# Patient Record
Sex: Male | Born: 1960 | Race: Black or African American | Hispanic: No | Marital: Single | Smoking: Current every day smoker
Health system: Southern US, Community
[De-identification: ages and names within clinical notes are randomized; demographics above are authoritative.]

## PROBLEM LIST (undated history)

## (undated) DIAGNOSIS — I10 Essential (primary) hypertension: Secondary | ICD-10-CM

## (undated) DIAGNOSIS — C801 Malignant (primary) neoplasm, unspecified: Secondary | ICD-10-CM

## (undated) HISTORY — DX: Essential (primary) hypertension: I10

## (undated) HISTORY — PX: NO PAST SURGERIES: SHX2092

---

## 2008-08-23 ENCOUNTER — Emergency Department: Payer: Self-pay | Admitting: Unknown Physician Specialty

## 2014-12-01 ENCOUNTER — Emergency Department
Admission: EM | Admit: 2014-12-01 | Discharge: 2014-12-01 | Disposition: A | Discharge status: Home / Self Care | Payer: Self-pay | Attending: Emergency Medicine | Admitting: Emergency Medicine

## 2014-12-01 ENCOUNTER — Encounter: Payer: Self-pay | Admitting: *Deleted

## 2014-12-01 DIAGNOSIS — G629 Polyneuropathy, unspecified: Secondary | ICD-10-CM | POA: Insufficient documentation

## 2014-12-01 DIAGNOSIS — Z720 Tobacco use: Secondary | ICD-10-CM | POA: Insufficient documentation

## 2014-12-01 DIAGNOSIS — M79672 Pain in left foot: Secondary | ICD-10-CM | POA: Insufficient documentation

## 2014-12-01 LAB — GLUCOSE, CAPILLARY: GLUCOSE-CAPILLARY: 98 mg/dL (ref 65–99)

## 2014-12-01 MED ORDER — NAPROXEN 500 MG PO TABS
500.0000 mg | ORAL_TABLET | Freq: Once | ORAL | Status: AC
  Administered 2014-12-01: 500 mg via ORAL

## 2014-12-01 MED ORDER — NAPROXEN 500 MG PO TABS
ORAL_TABLET | ORAL | Status: AC
  Administered 2014-12-01: 500 mg via ORAL
  Filled 2014-12-01: qty 1

## 2014-12-01 NOTE — ED Notes (Signed)
Pt. States feet burning for the past year.  Pt. States "It feels like I am walking on layers of skin"  Pt. States job keeps him on his feet on concrete.  Pt. States "I drink about a pint of liqueur every day.

## 2014-12-01 NOTE — Discharge Instructions (Signed)

## 2014-12-01 NOTE — ED Notes (Signed)
Patient c/o both feet hurting, feels like burning.

## 2014-12-01 NOTE — ED Provider Notes (Signed)
[facility] Emergency Department Provider Note  ____________________________________________  Time seen: On arrival  I have reviewed the triage vital signs and the nursing notes.   HISTORY  Chief Complaint Foot Pain    HPI Tyler Holloway is a 54 y.o. male who presents with complaints of burning in bilateral feet. He reports the bottom of his feet feel like he is walking on layers of skin. He has had these symptoms for approximately one year. He denies a history of diabetes. Does admit to alcohol abuse but does not want help with a sore detox. He does walk a lot but he wears boots    History reviewed. No pertinent past medical history.  There are no active problems to display for this patient.   History reviewed. No pertinent past surgical history.  No current outpatient prescriptions on file.  Allergies Review of patient's allergies indicates no known allergies.  No family history on file.  Social History Social History  Substance Use Topics  . Smoking status: Current Every Day Smoker  . Smokeless tobacco: None  . Alcohol Use: Yes    Review of Systems  Constitutional: Negative for fever.     Musculoskeletal: Negative for back pain. Skin: Negative for rash. Neurological: Negative for headaches or focal weakness   ____________________________________________   PHYSICAL EXAM:  VITAL SIGNS: ED Triage Vitals  Enc Vitals Group     BP 12/01/14 1958 150/85 mmHg     Pulse Rate 12/01/14 1958 101     Resp 12/01/14 1958 18     Temp 12/01/14 1958 98.1 F (36.7 C)     Temp Source 12/01/14 1958 Oral     SpO2 12/01/14 1958 99 %     Weight 12/01/14 1958 175 lb (79.379 kg)     Height 12/01/14 1958 6\' 3"  (1.905 m)     Head Cir --      Peak Flow --      Pain Score 12/01/14 2000 10     Pain Loc --      Pain Edu? --      Excl. in [place]? --      Constitutional: Alert and oriented. Well appearing and in no distress. Eyes: Conjunctivae are  normal.  ENT   Head: Normocephalic and atraumatic.   Mouth/Throat: Mucous membranes are moist. Cardiovascular: Normal rate, regular rhythm.  Respiratory: Normal respiratory effort without tachypnea nor retractions.  Gastrointestinal: Soft and non-tender in all quadrants. No distention. There is no CVA tenderness. Musculoskeletal: Nontender with normal range of motion in all extremities. Foot exam is unremarkable bilaterally. He does have somewhat flat feet but no tenderness to palpation or abnormalities. No rash. Neurologic:  Normal speech and language. No gross focal neurologic deficits are appreciated. Skin:  Skin is warm, dry and intact. No rash noted. Psychiatric: Mood and affect are normal. Patient exhibits appropriate insight and judgment.  ____________________________________________    LABS (pertinent positives/negatives)  Labs Reviewed  GLUCOSE, CAPILLARY    ____________________________________________     ____________________________________________    RADIOLOGY I have personally reviewed any xrays that were ordered on this patient: None  ____________________________________________   PROCEDURES  Procedure(s) performed: none   ____________________________________________   INITIAL IMPRESSION / ASSESSMENT AND PLAN / ED COURSE  Pertinent labs & imaging results that were available during my care of the patient were reviewed by me and considered in my medical decision making (see chart for details).  Patient with relatively benign exam. Suspect neuropathy possibly related alcohol abuse. I've  asked him to follow up with outpatient PCP for further evaluation  ____________________________________________   FINAL CLINICAL IMPRESSION(S) / ED DIAGNOSES  Final diagnoses:  Neuropathy ([place])     Lavonia Drafts, MD 12/01/14 [phone]

## 2014-12-01 NOTE — ED Notes (Signed)
Pt. Going home with family.  Pt. [facility].

## 2014-12-25 ENCOUNTER — Ambulatory Visit: Payer: Self-pay

## 2014-12-25 LAB — BASIC METABOLIC PANEL
BUN: 13 mg/dL (ref 4–21)
Creatinine: 1.1 mg/dL (ref 0.6–1.3)
GLUCOSE: 93 mg/dL
Potassium: 4.4 mmol/L (ref 3.4–5.3)
SODIUM: 140 mmol/L (ref 137–147)

## 2014-12-25 LAB — CBC AND DIFFERENTIAL
HEMATOCRIT: 43 % (ref 41–53)
Hemoglobin: 14.7 g/dL (ref 13.5–17.5)
Neutrophils Absolute: 2 /uL
Platelets: 121 10*3/uL — AB (ref 150–399)
WBC: 5.6 10^3/mL

## 2014-12-25 LAB — TSH: TSH: 3.2 u[IU]/mL (ref 0.41–5.90)

## 2014-12-25 LAB — HEPATIC FUNCTION PANEL
ALT: 85 U/L — AB (ref 10–40)
AST: 100 U/L — AB (ref 14–40)
Alkaline Phosphatase: 129 U/L — AB (ref 25–125)
BILIRUBIN, TOTAL: 0.9 mg/dL

## 2015-01-01 ENCOUNTER — Ambulatory Visit: Payer: Self-pay

## 2015-01-03 ENCOUNTER — Ambulatory Visit: Payer: Self-pay

## 2015-02-14 ENCOUNTER — Ambulatory Visit: Payer: Self-pay

## 2015-02-14 DIAGNOSIS — N400 Enlarged prostate without lower urinary tract symptoms: Secondary | ICD-10-CM | POA: Insufficient documentation

## 2015-02-14 DIAGNOSIS — G629 Polyneuropathy, unspecified: Secondary | ICD-10-CM | POA: Insufficient documentation

## 2015-02-22 ENCOUNTER — Ambulatory Visit: Payer: Self-pay

## 2015-02-25 ENCOUNTER — Ambulatory Visit: Payer: Self-pay

## 2015-03-06 ENCOUNTER — Institutional Professional Consult (permissible substitution): Payer: Self-pay | Admitting: Urology

## 2015-03-21 ENCOUNTER — Ambulatory Visit: Payer: Self-pay

## 2015-03-28 DIAGNOSIS — N400 Enlarged prostate without lower urinary tract symptoms: Secondary | ICD-10-CM

## 2015-03-28 DIAGNOSIS — G588 Other specified mononeuropathies: Secondary | ICD-10-CM

## 2015-04-26 DIAGNOSIS — R768 Other specified abnormal immunological findings in serum: Secondary | ICD-10-CM | POA: Insufficient documentation

## 2015-05-08 ENCOUNTER — Institutional Professional Consult (permissible substitution): Payer: Self-pay | Admitting: Urology

## 2015-05-09 ENCOUNTER — Encounter: Payer: Self-pay | Admitting: Pharmacist

## 2015-05-15 ENCOUNTER — Encounter: Payer: Self-pay | Admitting: Urology

## 2015-05-15 ENCOUNTER — Institutional Professional Consult (permissible substitution): Payer: Self-pay | Admitting: Urology

## 2015-09-11 ENCOUNTER — Encounter: Payer: Self-pay | Admitting: Internal Medicine

## 2015-09-11 ENCOUNTER — Ambulatory Visit: Payer: Self-pay | Admitting: Internal Medicine

## 2015-09-11 DIAGNOSIS — B181 Chronic viral hepatitis B without delta-agent: Secondary | ICD-10-CM

## 2015-09-11 DIAGNOSIS — B1910 Unspecified viral hepatitis B without hepatic coma: Secondary | ICD-10-CM | POA: Insufficient documentation

## 2015-09-11 MED ORDER — NAPROXEN 250 MG PO TABS: 250.0000 mg | ORAL_TABLET | Freq: Two times a day (BID) | ORAL | 3 refills | Status: DC

## 2015-09-11 NOTE — Patient Instructions (Signed)
F/u in 3 months Labs today

## 2015-09-11 NOTE — Progress Notes (Signed)
   Subjective:    Patient ID: Tyler Holloway, male    DOB: Nov 30, 1960, 55 y.o.   MRN: [id number]  HPI   Pt presents w/ chronic foot pain in both feet, reports medication doesn't relieve pain, reports pain feels like his socks are loose and this feeling causes sleeplessness   BP is currently elevated at 144/89 Active hepatitis B  Patient Active Problem List   Diagnosis Date Noted  . Hepatitis B infection without delta agent without hepatic coma 09/11/2015  . Peripheral neuropathy ([place]) 02/14/2015  . Benign prostatic hyperplasia 02/14/2015     Medication List       Accurate as of 09/11/15 10:00 AM. Always use your most recent med list.          gabapentin 600 MG tablet Commonly known as:  NEURONTIN Take 600 mg by mouth daily.   tamsulosin 0.4 MG Caps capsule Commonly known as:  FLOMAX Take 0.4 mg by mouth daily.        Review of Systems    R foot examined, callous present on arch of foot, previous neuropathy normal vibrations of both feet by tuning fork Proprioception is normal Objective:   Physical Exam  BP (!) 144/89   Pulse 74   Temp 98.1 F (36.7 C)   Wt 164 lb (74.4 kg)   BMI 20.50 kg/m        Assessment & Plan:  Discontinue Gabapentin Naproxen prescribed  Hepatitis B retreated at [facility] Pain believed to not be caused by neuropathy, possibly related to Hepatitis B   Labs today: Met C, CBC, A1C, Lipid, TSH, Thiamin, UA F/u in 3 months after [facility] appt.  Referral to X-rays of both feet

## 2015-09-12 ENCOUNTER — Other Ambulatory Visit: Payer: Self-pay

## 2015-09-12 MED ORDER — NAPROXEN 250 MG PO TABS: 250.0000 mg | ORAL_TABLET | Freq: Two times a day (BID) | ORAL | 3 refills | Status: DC

## 2015-09-14 LAB — CBC WITH DIFFERENTIAL
BASOS: 1 %
Basophils Absolute: 0 10*3/uL (ref 0.0–0.2)
EOS (ABSOLUTE): 0.1 10*3/uL (ref 0.0–0.4)
EOS: 3 %
HEMOGLOBIN: 14.4 g/dL (ref 12.6–17.7)
Hematocrit: 42.8 % (ref 37.5–51.0)
IMMATURE GRANS (ABS): 0 10*3/uL (ref 0.0–0.1)
Immature Granulocytes: 0 %
LYMPHS: 42 %
Lymphocytes Absolute: 1.4 10*3/uL (ref 0.7–3.1)
MCH: 32.7 pg (ref 26.6–33.0)
MCHC: 33.6 g/dL (ref 31.5–35.7)
MCV: 97 fL (ref 79–97)
MONOCYTES: 16 %
Monocytes Absolute: 0.5 10*3/uL (ref 0.1–0.9)
NEUTROS ABS: 1.2 10*3/uL — AB (ref 1.4–7.0)
NEUTROS PCT: 38 %
RBC: 4.41 x10E6/uL (ref 4.14–5.80)
RDW: 13.9 % (ref 12.3–15.4)
WBC: 3.2 10*3/uL — AB (ref 3.4–10.8)

## 2015-09-14 LAB — URINALYSIS
BILIRUBIN UA: NEGATIVE
Glucose, UA: NEGATIVE
Ketones, UA: NEGATIVE
Leukocytes, UA: NEGATIVE
Nitrite, UA: NEGATIVE
PH UA: 6 (ref 5.0–7.5)
RBC, UA: NEGATIVE
Specific Gravity, UA: 1.022 (ref 1.005–1.030)
Urobilinogen, Ur: 1 mg/dL (ref 0.2–1.0)

## 2015-09-14 LAB — LIPID PANEL
Chol/HDL Ratio: 2.6 ratio units (ref 0.0–5.0)
Cholesterol, Total: 120 mg/dL (ref 100–199)
HDL: 47 mg/dL (ref >39)
LDL CALC: 55 mg/dL (ref 0–99)
Triglycerides: 88 mg/dL (ref 0–149)
VLDL CHOLESTEROL CAL: 18 mg/dL (ref 5–40)

## 2015-09-14 LAB — COMPREHENSIVE METABOLIC PANEL
A/G RATIO: 1.1 — AB (ref 1.2–2.2)
ALBUMIN: 4 g/dL (ref 3.5–5.5)
ALT: 110 IU/L — AB (ref 0–44)
AST: 203 IU/L — ABNORMAL HIGH (ref 0–40)
Alkaline Phosphatase: 123 IU/L — ABNORMAL HIGH (ref 39–117)
BUN / CREAT RATIO: 10 (ref 9–20)
BUN: 14 mg/dL (ref 6–24)
Bilirubin Total: 1.4 mg/dL — ABNORMAL HIGH (ref 0.0–1.2)
CALCIUM: 9 mg/dL (ref 8.7–10.2)
CO2: 26 mmol/L (ref 18–29)
Chloride: 101 mmol/L (ref 96–106)
Creatinine, Ser: 1.42 mg/dL — ABNORMAL HIGH (ref 0.76–1.27)
GFR, EST AFRICAN AMERICAN: 64 mL/min/{1.73_m2} (ref >59)
GFR, EST NON AFRICAN AMERICAN: 55 mL/min/{1.73_m2} — AB (ref >59)
Globulin, Total: 3.5 g/dL (ref 1.5–4.5)
Glucose: 97 mg/dL (ref 65–99)
POTASSIUM: 4.4 mmol/L (ref 3.5–5.2)
Sodium: 139 mmol/L (ref 134–144)
TOTAL PROTEIN: 7.5 g/dL (ref 6.0–8.5)

## 2015-09-14 LAB — HEMOGLOBIN A1C
Est. average glucose Bld gHb Est-mCnc: 114 mg/dL
Hgb A1c MFr Bld: 5.6 % (ref 4.8–5.6)

## 2015-09-14 LAB — TSH: TSH: 3.09 u[IU]/mL (ref 0.450–4.500)

## 2015-09-14 LAB — VITAMIN B1: THIAMINE: 100.8 nmol/L (ref 66.5–200.0)

## 2015-09-17 ENCOUNTER — Other Ambulatory Visit: Payer: Self-pay

## 2015-09-17 DIAGNOSIS — M79672 Pain in left foot: Principal | ICD-10-CM

## 2015-09-17 DIAGNOSIS — M79671 Pain in right foot: Secondary | ICD-10-CM

## 2015-09-17 MED ORDER — NAPROXEN 250 MG PO TABS: 250.0000 mg | ORAL_TABLET | Freq: Two times a day (BID) | ORAL | 3 refills | Status: DC

## 2015-09-23 ENCOUNTER — Telehealth: Payer: Self-pay
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
## 2015-10-09 ENCOUNTER — Ambulatory Visit: Payer: Self-pay | Admitting: Internal Medicine

## 2015-11-04 DIAGNOSIS — B1910 Unspecified viral hepatitis B without hepatic coma: Secondary | ICD-10-CM | POA: Insufficient documentation

## 2015-11-14 ENCOUNTER — Other Ambulatory Visit: Payer: Self-pay | Admitting: Internal Medicine

## 2015-11-14 DIAGNOSIS — G8929 Other chronic pain: Secondary | ICD-10-CM

## 2015-11-14 DIAGNOSIS — M79671 Pain in right foot: Principal | ICD-10-CM

## 2015-11-14 DIAGNOSIS — M79672 Pain in left foot: Secondary | ICD-10-CM

## 2015-11-20 ENCOUNTER — Ambulatory Visit: Payer: Self-pay | Admitting: Internal Medicine

## 2015-12-03 ENCOUNTER — Ambulatory Visit
Admission: RE | Admit: 2015-12-03 | Discharge: 2015-12-03 | Disposition: A | Discharge status: Home / Self Care | Payer: Self-pay | Source: Ambulatory Visit | Attending: Internal Medicine | Admitting: Internal Medicine

## 2015-12-03 DIAGNOSIS — M79671 Pain in right foot: Secondary | ICD-10-CM | POA: Insufficient documentation

## 2015-12-03 DIAGNOSIS — M79672 Pain in left foot: Secondary | ICD-10-CM | POA: Insufficient documentation

## 2015-12-03 DIAGNOSIS — R938 Abnormal findings on diagnostic imaging of other specified body structures: Secondary | ICD-10-CM | POA: Insufficient documentation

## 2015-12-03 DIAGNOSIS — G8929 Other chronic pain: Secondary | ICD-10-CM

## 2015-12-11 ENCOUNTER — Ambulatory Visit: Payer: Self-pay | Admitting: Internal Medicine

## 2015-12-11 ENCOUNTER — Encounter: Payer: Self-pay | Admitting: Internal Medicine

## 2015-12-11 VITALS — BP 148/87 | HR 103 | Wt 168.0 lb

## 2015-12-11 DIAGNOSIS — B1920 Unspecified viral hepatitis C without hepatic coma: Secondary | ICD-10-CM

## 2015-12-11 DIAGNOSIS — B181 Chronic viral hepatitis B without delta-agent: Secondary | ICD-10-CM

## 2015-12-11 MED ORDER — GABAPENTIN 100 MG PO CAPS: 100.0000 mg | ORAL_CAPSULE | Freq: Three times a day (TID) | ORAL | 3 refills | Status: DC

## 2015-12-11 NOTE — Progress Notes (Signed)
   Subjective:    Patient ID: Tyler Holloway, male    DOB: [birth year], 55 y.o.   MRN: [id number]  HPI  Pt f/u for med refill. Pt reports has not received medications for chronic Hep C. Visit to [facility] on 10/9.  Pt complains of chronic pain in right foot. Reports gabapentin is not working. Xray results show arthritis.  Patient Active Problem List   Diagnosis Date Noted  . Hepatitis B infection without delta agent without hepatic coma 09/11/2015  . Peripheral neuropathy ([place]) 02/14/2015  . Benign prostatic hyperplasia 02/14/2015     Medication List       Accurate as of 12/11/15  9:18 AM. Always use your most recent med list.          gabapentin 100 MG capsule Commonly known as:  NEURONTIN Take 100 mg by mouth.   naproxen 250 MG tablet Commonly known as:  NAPROSYN Take 1 tablet (250 mg total) by mouth 2 (two) times daily with a meal.   tamsulosin 0.4 MG Caps capsule Commonly known as:  FLOMAX Take 0.4 mg by mouth daily.        Review of Systems     Objective:   Physical Exam  Constitutional: He is oriented to person, place, and time.  Cardiovascular: Normal rate, regular rhythm and normal heart sounds.   Pulmonary/Chest: Effort normal and breath sounds normal.  Neurological: He is alert and oriented to person, place, and time.    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 Nonsteroil and inflammatories recommended.     Assessment & Plan:   Needs Hep C medications from [facility] infectious disease clinic per Dr. Allen Norris, infectious disease specialist who saw Pt on 10/9.   F/u in January. No labs.

## 2015-12-11 NOTE — Patient Instructions (Signed)
F/u in January for Hep C. No labs.

## 2016-02-12 ENCOUNTER — Ambulatory Visit: Payer: Self-pay | Admitting: Internal Medicine

## 2017-02-08 ENCOUNTER — Encounter: Payer: Self-pay | Admitting: Emergency Medicine

## 2017-02-08 ENCOUNTER — Emergency Department
Admission: EM | Admit: 2017-02-08 | Discharge: 2017-02-08 | Disposition: A | Discharge status: Home / Self Care | Payer: Self-pay | Attending: Emergency Medicine | Admitting: Emergency Medicine

## 2017-02-08 ENCOUNTER — Other Ambulatory Visit: Payer: Self-pay

## 2017-02-08 DIAGNOSIS — R21 Rash and other nonspecific skin eruption: Secondary | ICD-10-CM | POA: Insufficient documentation

## 2017-02-08 DIAGNOSIS — Z79899 Other long term (current) drug therapy: Secondary | ICD-10-CM | POA: Insufficient documentation

## 2017-02-08 DIAGNOSIS — F17210 Nicotine dependence, cigarettes, uncomplicated: Secondary | ICD-10-CM | POA: Insufficient documentation

## 2017-02-08 DIAGNOSIS — I10 Essential (primary) hypertension: Secondary | ICD-10-CM | POA: Insufficient documentation

## 2017-02-08 MED ORDER — PREDNISONE 10 MG PO TABS: ORAL_TABLET | ORAL | 0 refills | Status: DC

## 2017-02-08 MED ORDER — TRIAMCINOLONE ACETONIDE 0.5 % EX OINT: 1.0000 "application " | TOPICAL_OINTMENT | Freq: Two times a day (BID) | CUTANEOUS | 0 refills | Status: DC

## 2017-02-08 MED ORDER — METHYLPREDNISOLONE SODIUM SUCC 125 MG IJ SOLR
125.0000 mg | Freq: Once | INTRAMUSCULAR | Status: AC
  Administered 2017-02-08: 125 mg via INTRAMUSCULAR
  Filled 2017-02-08: qty 2

## 2017-02-08 NOTE — ED Notes (Signed)
See triage note  Presents with rash to abd and now has areas to both arms  positive itching  States he has used hydrocortisone cream w/o relief

## 2017-02-08 NOTE — ED Triage Notes (Signed)
First Nurse Note:  C/O rash to arms and back of neck x 2 months.

## 2017-02-08 NOTE — ED Provider Notes (Signed)
[facility] Emergency Department Provider Note  ____________________________________________  Time seen: Approximately 3:49 PM  I have reviewed the triage vital signs and the nursing notes.   HISTORY  Chief Complaint Rash    HPI Tyler Holloway is a 57 y.o. male with past medical history of hepatitis B and C that presents emergency department for evaluation of itchy rash to bilateral arms and abdomen for 2 months.  Rash is not painful.  Rash has stayed about the same in character and size for the last month.  He has been applying hydrocortisone cream, which helps the itching.    Past Medical History:  Diagnosis Date  . Hypertension     Patient Active Problem List   Diagnosis Date Noted  . Hepatitis B infection without delta agent without hepatic coma 09/11/2015  . Peripheral neuropathy 02/14/2015  . Benign prostatic hyperplasia 02/14/2015    History reviewed. No pertinent surgical history.  Prior to Admission medications   Medication Sig Start Date End Date Taking? Authorizing Provider  gabapentin (NEURONTIN) 100 MG capsule Take 1 capsule (100 mg total) by mouth 3 (three) times daily. 12/11/15   Tawni Millers, MD  naproxen (NAPROSYN) 250 MG tablet Take 1 tablet (250 mg total) by mouth 2 (two) times daily with a meal. Patient not taking: Reported on 12/11/2015 09/17/15   Tawni Millers, MD  predniSONE (DELTASONE) 10 MG tablet Take 6 tablets on day 1, take 5 tablets on day 2, take 4 tablets on day 3, take 3 tablets on day 4, take 2 tablets on day 5, take 1 tablet on day 6 02/08/17   Laban Emperor, PA-C  tamsulosin (FLOMAX) 0.4 MG CAPS capsule Take 0.4 mg by mouth daily. 02/14/15   [provider]  triamcinolone ointment (KENALOG) 0.5 % Apply 1 application topically 2 (two) times daily. 02/08/17   Laban Emperor, PA-C    Allergies Pollen extract  Family History  Problem Relation Age of Onset  . Heart Problems Sister     Social  History Social History   Tobacco Use  . Smoking status: Current Every Day Smoker    Packs/day: 1.00    Years: 3.00    Pack years: 3.00    Types: Cigarettes  . Smokeless tobacco: Current User  Substance Use Topics  . Alcohol use: Yes  . Drug use: Not on file     Review of Systems  Constitutional: No fever/chills Cardiovascular: No chest pain. Respiratory: No SOB. Musculoskeletal: Negative for musculoskeletal pain. Skin: Negative for abrasions, lacerations, ecchymosis   ____________________________________________   PHYSICAL EXAM:  VITAL SIGNS: ED Triage Vitals  Enc Vitals Group     BP 02/08/17 1540 (!) 142/79     Pulse Rate 02/08/17 1540 87     Resp 02/08/17 1540 18     Temp 02/08/17 1540 98.4 F (36.9 C)     Temp Source 02/08/17 1540 Oral     SpO2 02/08/17 1540 97 %     Weight 02/08/17 1531 160 lb (72.6 kg)     Height 02/08/17 1531 6\' 2"  (1.88 m)     Head Circumference --      Peak Flow --      Pain Score 02/08/17 1530 0     Pain Loc --      Pain Edu? --      Excl. in [place]? --      Constitutional: Alert and oriented. Well appearing and in no acute distress. Eyes: Conjunctivae are normal. PERRL.  EOMI. Head: Atraumatic. ENT:      Ears:      Nose: No congestion/rhinnorhea.      Mouth/Throat: Mucous membranes are moist.  Neck: No stridor.  Cardiovascular: Normal rate, regular rhythm.  Good peripheral circulation. Respiratory: Normal respiratory effort without tachypnea or retractions. Lungs CTAB. Good air entry to the bases with no decreased or absent breath sounds. Musculoskeletal: Full range of motion to all extremities. No gross deformities appreciated. Neurologic:  Normal speech and language. No gross focal neurologic deficits are appreciated.  Skin:  Skin is warm, dry and intact.  Large purple/gry patch of scaling and flaking skin that extends from bilateral upper arms to upper forearms. Large scaling patch to lower abdomen.  Nontender to  palpation.   ____________________________________________   LABS (all labs ordered are listed, but only abnormal results are displayed)  Labs Reviewed - No data to display ____________________________________________  EKG   ____________________________________________  RADIOLOGY  No results found.  ____________________________________________    PROCEDURES  Procedure(s) performed:    Procedures    Medications  methylPREDNISolone sodium succinate (SOLU-MEDROL) 125 mg/2 mL injection 125 mg (125 mg Intramuscular Given 02/08/17 1618)     ____________________________________________   INITIAL IMPRESSION / ASSESSMENT AND PLAN / ED COURSE  Pertinent labs & imaging results that were available during my care of the patient were reviewed by me and considered in my medical decision making (see chart for details).  Review of the [place] CSRS was performed in accordance of the [place] prior to dispensing any controlled drugs.   Patient presented to the emergency department for evaluation of rash for 2 months. Vital signs and exam are reassuring. Rash has a similar appearance to psoriasis. It is likely inflammatory from chronic Hepatitis B and C. No indication of bacterial infection. Patient will be discharged home with prescriptions for prednisone and triamcinalone. Patient is to follow up with PCP and dermatology as directed. Patient is given ED precautions to return to the ED for any worsening or new symptoms.     ____________________________________________  FINAL CLINICAL IMPRESSION(S) / ED DIAGNOSES  Final diagnoses:  Rash      NEW MEDICATIONS STARTED DURING THIS VISIT:  ED Discharge Orders        Ordered    predniSONE (DELTASONE) 10 MG tablet     02/08/17 1606    triamcinolone ointment (KENALOG) 0.5 %  2 times daily     02/08/17 1606          This chart was dictated using voice recognition software/Dragon. Despite best efforts to proofread, errors can  occur which can change the meaning. Any change was purely unintentional.   Laban Emperor, PA-C 02/08/17 1807    Lavonia Drafts, MD 02/16/17 [id number]

## 2018-05-04 ENCOUNTER — Emergency Department
Admission: EM | Admit: 2018-05-04 | Discharge: 2018-05-04 | Disposition: A | Discharge status: Home / Self Care | Payer: BLUE CROSS/BLUE SHIELD | Attending: Emergency Medicine | Admitting: Emergency Medicine

## 2018-05-04 ENCOUNTER — Other Ambulatory Visit: Payer: Self-pay

## 2018-05-04 DIAGNOSIS — S1181XA Laceration without foreign body of other specified part of neck, initial encounter: Secondary | ICD-10-CM | POA: Diagnosis present

## 2018-05-04 DIAGNOSIS — Y929 Unspecified place or not applicable: Secondary | ICD-10-CM | POA: Insufficient documentation

## 2018-05-04 DIAGNOSIS — Z79899 Other long term (current) drug therapy: Secondary | ICD-10-CM | POA: Insufficient documentation

## 2018-05-04 DIAGNOSIS — F17210 Nicotine dependence, cigarettes, uncomplicated: Secondary | ICD-10-CM | POA: Insufficient documentation

## 2018-05-04 DIAGNOSIS — Y9389 Activity, other specified: Secondary | ICD-10-CM | POA: Insufficient documentation

## 2018-05-04 DIAGNOSIS — W25XXXA Contact with sharp glass, initial encounter: Secondary | ICD-10-CM | POA: Insufficient documentation

## 2018-05-04 DIAGNOSIS — I10 Essential (primary) hypertension: Secondary | ICD-10-CM | POA: Diagnosis not present

## 2018-05-04 DIAGNOSIS — Y999 Unspecified external cause status: Secondary | ICD-10-CM | POA: Insufficient documentation

## 2018-05-04 MED ORDER — CEPHALEXIN 500 MG PO CAPS: 500.0000 mg | ORAL_CAPSULE | Freq: Two times a day (BID) | ORAL | 0 refills | Status: AC
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
## 2019-02-21 ENCOUNTER — Encounter: Payer: Self-pay | Admitting: Emergency Medicine

## 2019-02-21 ENCOUNTER — Emergency Department
Admission: EM | Admit: 2019-02-21 | Discharge: 2019-02-21 | Disposition: A | Discharge status: Home / Self Care | Payer: BC Managed Care – PPO | Attending: Emergency Medicine | Admitting: Emergency Medicine

## 2019-02-21 ENCOUNTER — Emergency Department: Payer: BC Managed Care – PPO

## 2019-02-21 DIAGNOSIS — Z20822 Contact with and (suspected) exposure to covid-19: Secondary | ICD-10-CM | POA: Insufficient documentation

## 2019-02-21 DIAGNOSIS — M7989 Other specified soft tissue disorders: Secondary | ICD-10-CM | POA: Diagnosis not present

## 2019-02-21 DIAGNOSIS — I10 Essential (primary) hypertension: Secondary | ICD-10-CM | POA: Diagnosis not present

## 2019-02-21 DIAGNOSIS — R070 Pain in throat: Secondary | ICD-10-CM | POA: Diagnosis present

## 2019-02-21 DIAGNOSIS — F17210 Nicotine dependence, cigarettes, uncomplicated: Secondary | ICD-10-CM | POA: Insufficient documentation

## 2019-02-21 DIAGNOSIS — Z79899 Other long term (current) drug therapy: Secondary | ICD-10-CM | POA: Insufficient documentation

## 2019-02-21 LAB — BASIC METABOLIC PANEL
Anion gap: 7 (ref 5–15)
BUN: 13 mg/dL (ref 6–20)
CO2: 27 mmol/L (ref 22–32)
Calcium: 9.1 mg/dL (ref 8.9–10.3)
Chloride: 106 mmol/L (ref 98–111)
Creatinine, Ser: 1.2 mg/dL (ref 0.61–1.24)
GFR calc Af Amer: >60 mL/min (ref >60)
GFR calc non Af Amer: >60 mL/min (ref >60)
Glucose, Bld: 88 mg/dL (ref 70–99)
Potassium: 4 mmol/L (ref 3.5–5.1)
Sodium: 140 mmol/L (ref 135–145)

## 2019-02-21 LAB — SARS CORONAVIRUS 2 (TAT 6-24 HRS): SARS Coronavirus 2: NEGATIVE

## 2019-02-21 LAB — POC SARS CORONAVIRUS 2 AG: SARS Coronavirus 2 Ag: NEGATIVE

## 2019-02-21 MED ORDER — OXYCODONE-ACETAMINOPHEN 5-325 MG PO TABS: 1.0000 | ORAL_TABLET | ORAL | 0 refills | Status: DC | PRN

## 2019-02-21 MED ORDER — LIDOCAINE VISCOUS HCL 2 % MT SOLN
15.0000 mL | Freq: Once | OROMUCOSAL | Status: AC
Start: 2019-02-21 — End: 2019-02-21
  Administered 2019-02-21: 04:00:00 15 mL via OROMUCOSAL
  Filled 2019-02-21: qty 15

## 2019-02-21 MED ORDER — LIDOCAINE VISCOUS HCL 2 % MT SOLN
15.0000 mL | Freq: Once | OROMUCOSAL | Status: AC
  Administered 2019-02-21: 15 mL via OROMUCOSAL
  Filled 2019-02-21: qty 15

## 2019-02-21 MED ORDER — IOHEXOL 300 MG/ML  SOLN
75.0000 mL | Freq: Once | INTRAMUSCULAR | Status: AC | PRN
  Administered 2019-02-21: 75 mL via INTRAVENOUS

## 2019-02-21 MED ORDER — OXYCODONE-ACETAMINOPHEN 5-325 MG PO TABS
1.0000 | ORAL_TABLET | Freq: Once | ORAL | Status: AC
  Administered 2019-02-21: 04:00:00 1 via ORAL
  Filled 2019-02-21: qty 1

## 2019-02-21 NOTE — ED Provider Notes (Signed)
[facility] Emergency Department Provider Note  ____________________________________________   First MD Initiated Contact with Patient 02/21/19 0154     (approximate)  I have reviewed the triage vital signs and the nursing notes.   HISTORY  Chief Complaint Sore Throat    HPI Tyler Holloway is a 59 y.o. male with below list of previous medical conditions presents to the emergency department secondary to a 1 month history of throat pain.  Patient denies any fever does admit to occasional cough.  Patient does admit to smoking 1 pack of cigarettes per day for many years.        Past Medical History:  Diagnosis Date  . Hypertension     Patient Active Problem List   Diagnosis Date Noted  . Hepatitis B infection without delta agent without hepatic coma 09/11/2015  . Peripheral neuropathy 02/14/2015  . Benign prostatic hyperplasia 02/14/2015    History reviewed. No pertinent surgical history.  Prior to Admission medications   Medication Sig Start Date End Date Taking? Authorizing Provider  gabapentin (NEURONTIN) 100 MG capsule Take 1 capsule (100 mg total) by mouth 3 (three) times daily. 12/11/15   Tawni Millers, MD  naproxen (NAPROSYN) 250 MG tablet Take 1 tablet (250 mg total) by mouth 2 (two) times daily with a meal. Patient not taking: Reported on 12/11/2015 09/17/15   Tawni Millers, MD  oxyCODONE-acetaminophen (PERCOCET) 5-325 MG tablet Take 1 tablet by mouth every 4 (four) hours as needed for severe pain. 02/21/19 02/21/20  Gregor Hams, MD  predniSONE (DELTASONE) 10 MG tablet Take 6 tablets on day 1, take 5 tablets on day 2, take 4 tablets on day 3, take 3 tablets on day 4, take 2 tablets on day 5, take 1 tablet on day 6 02/08/17   Laban Emperor, PA-C  tamsulosin (FLOMAX) 0.4 MG CAPS capsule Take 0.4 mg by mouth daily. 02/14/15   [provider]  triamcinolone ointment (KENALOG) 0.5 % Apply 1 application topically 2 (two) times daily.  02/08/17   Laban Emperor, PA-C    Allergies Pollen extract  Family History  Problem Relation Age of Onset  . Heart Problems Sister     Social History Social History   Tobacco Use  . Smoking status: Current Every Day Smoker    Packs/day: 1.00    Years: 3.00    Pack years: 3.00    Types: Cigarettes  . Smokeless tobacco: Current User  Substance Use Topics  . Alcohol use: Yes  . Drug use: Not on file    Review of Systems Constitutional: No fever/chills Eyes: No visual changes. ENT: Positive for throat pain Cardiovascular: Denies chest pain. Respiratory: Denies shortness of breath. Gastrointestinal: No abdominal pain.  No nausea, no vomiting.  No diarrhea.  No constipation. Genitourinary: Negative for dysuria. Musculoskeletal: Negative for neck pain.  Negative for back pain. Integumentary: Negative for rash. Neurological: Negative for headaches, focal weakness or numbness.  ____________________________________________   PHYSICAL EXAM:  VITAL SIGNS: ED Triage Vitals  Enc Vitals Group     BP 02/21/19 0030 (!) 165/102     Pulse Rate 02/21/19 0030 89     Resp 02/21/19 0030 18     Temp 02/21/19 0030 97.8 F (36.6 C)     Temp Source 02/21/19 0030 Oral     SpO2 02/21/19 0030 99 %     Weight 02/21/19 0031 77.6 kg (171 lb)     Height 02/21/19 0031 1.905 m (6\' 3" )  Head Circumference --      Peak Flow --      Pain Score --      Pain Loc --      Pain Edu? --      Excl. in [place]? --     Constitutional: Alert and oriented.  Eyes: Conjunctivae are normal.  Head: Atraumatic. Mouth/Throat: Patient is wearing a mask. Neck: No stridor.  Positive submandibular lymphadenopathy Cardiovascular: Normal rate, regular rhythm. Good peripheral circulation. Grossly normal heart sounds. Respiratory: Normal respiratory effort.  No retractions. Gastrointestinal: Soft and nontender. No distention. Musculoskeletal: No lower extremity tenderness nor edema. No gross deformities of  extremities. Neurologic:  Normal speech and language. No gross focal neurologic deficits are appreciated.  Skin:  Skin is warm, dry and intact. Psychiatric: Mood and affect are normal. Speech and behavior are normal.  ____________________________________________   LABS (all labs ordered are listed, but only abnormal results are displayed)  Labs Reviewed  SARS CORONAVIRUS 2 (TAT 6-24 HRS)  BASIC METABOLIC PANEL  POC SARS CORONAVIRUS 2 AG -  ED  POC SARS CORONAVIRUS 2 AG    RADIOLOGY I, [place] Cozetta Seif, personally viewed and evaluated these images (plain radiographs) as part of my medical decision making, as well as reviewing the written report by the radiologist.  ED MD interpretation: 2.9 x 1.3 cm polypoid soft tissue mass noted on CT neck with contrast concern for possible malignancy  Official radiology report(s): CT Soft Tissue Neck W Contrast  Result Date: 02/21/2019 CLINICAL DATA:  Initial evaluation for acute sore throat for 1 month. EXAM: CT NECK WITH CONTRAST TECHNIQUE: Multidetector CT imaging of the neck was performed using the standard protocol following the bolus administration of intravenous contrast. CONTRAST:  25mL OMNIPAQUE IOHEXOL 300 MG/ML  SOLN COMPARISON:  None available. FINDINGS: Pharynx and larynx: Small volume retained secretions seen within the oral cavity. Oral cavity otherwise within normal limits without abnormality. No visible tongue base mass. Palatine tonsils symmetric and within normal limits. Parapharyngeal fat maintained. Nasopharynx within normal limits. No retropharyngeal collection. Epiglottis within normal limits. There is asymmetric polypoid soft tissue positioned at the base of the left aryepiglottic fold measuring approximately 2.9 x 1.3 cm (series 2, image 64). Partial involvement of the adjacent left piriform sinus. Pre epiglottic fat is preserved. Small volume layering secretions noted within the adjacent hypopharynx. Glottis is opposed inferiorly  and not well assessed. Subglottic airway clear. Salivary glands: Salivary glands including the parotid glands and submandibular glands are within normal limits. Thyroid: Thyroid within normal limits. Lymph nodes: No pathologically enlarged lymph nodes seen within the neck. Vascular: Normal intravascular enhancement seen throughout the neck. Moderate atherosclerotic change about the aortic arch, carotid bifurcations, and carotid siphons. Limited intracranial: Unremarkable. Visualized orbits: Visualized globes and orbital soft tissues within normal limits. Mastoids and visualized paranasal sinuses: Mild mucosal thickening noted within the maxillary sinuses. Visualized paranasal sinuses are otherwise clear. Visualize mastoids and middle ear cavities are well pneumatized and free of fluid. Skeleton: No acute osseous abnormality. No discrete or worrisome osseous lesions. Moderate cervical spondylosis, most notable at C5-6 and C6-7. Upper chest: Visualized upper chest demonstrates no acute finding. Centrilobular and paraseptal emphysematous changes noted within the visualized lungs. Other: None. IMPRESSION: 1. Apparent 2.9 x 1.3 cm polypoid soft tissue mass positioned at the base of the left aryepiglottic fold, indeterminate, but could reflect a primary head and neck malignancy. ENT referral for further evaluation and direct visualization recommended. 2. No other acute abnormality within the neck.  No adenopathy. 3. Aortic Atherosclerosis (ICD10-I70.0) and Emphysema (ICD10-J43.9). Electronically Signed   By: Jeannine Boga M.D.   On: 02/21/2019 03:15     Procedures   ____________________________________________   INITIAL IMPRESSION / MDM / [place] / ED COURSE  As part of my medical decision making, I reviewed the following data within the electronic MEDICAL RECORD NUMBER   59 year old male presented with above-stated history and physical exam secondary to throat pain x1 month.  Concern for  possible soft tissue malignancy given duration of symptoms, absence of infectious symptoms and patient smoking history and as such CT soft tissue of the neck was performed which revealed a 2.9 x 1.3 cm soft tissue mass.  Spoke with the patient at length regarding clinical findings and the necessity of following up with ENT and oncology.      ____________________________________________  FINAL CLINICAL IMPRESSION(S) / ED DIAGNOSES  Final diagnoses:  Mass of soft tissue of neck     MEDICATIONS GIVEN DURING THIS VISIT:  Medications  lidocaine (XYLOCAINE) 2 % viscous mouth solution 15 mL (15 mLs Mouth/Throat Given 02/21/19 0235)  iohexol (OMNIPAQUE) 300 MG/ML solution 75 mL (75 mLs Intravenous Contrast Given 02/21/19 0247)  oxyCODONE-acetaminophen (PERCOCET/ROXICET) 5-325 MG per tablet 1 tablet (1 tablet Oral Provided for home use 02/21/19 0415)  lidocaine (XYLOCAINE) 2 % viscous mouth solution 15 mL (15 mLs Mouth/Throat Provided for home use 02/21/19 0415)     ED Discharge Orders         Ordered    oxyCODONE-acetaminophen (PERCOCET) 5-325 MG tablet  Every 4 hours PRN     02/21/19 0407          *Please note:  Festus Lennox Bellino was evaluated in Emergency Department on 02/21/2019 for the symptoms described in the history of present illness. He was evaluated in the context of the global COVID-19 pandemic, which necessitated consideration that the patient might be at risk for infection with the SARS-CoV-2 virus that causes COVID-19. Institutional protocols and algorithms that pertain to the evaluation of patients at risk for COVID-19 are in a state of rapid change based on information released by regulatory bodies including the CDC and federal and state organizations. These policies and algorithms were followed during the patient's care in the ED.  Some ED evaluations and interventions may be delayed as a result of limited staffing during the pandemic.*  Note:  This document was prepared using  Dragon voice recognition software and may include unintentional dictation errors.   Gregor Hams, MD 02/21/19 [phone]

## 2019-02-21 NOTE — ED Notes (Signed)
Pt wanting to leave.  MD notified, informed patient that CT results just came back and MD needs to review them with patient.  Pt agreed to wait until then.

## 2019-02-21 NOTE — ED Notes (Signed)
Patient ambulated to restroom on own power.

## 2019-02-21 NOTE — ED Triage Notes (Signed)
Pt c/o sore throat x1 month. Pt sts he was negative on COVID exam 4 weeks ago when symptoms started.

## 2019-02-23 ENCOUNTER — Encounter: Payer: Self-pay | Admitting: Oncology

## 2019-02-23 ENCOUNTER — Inpatient Hospital Stay: Payer: BC Managed Care – PPO | Attending: Oncology | Admitting: Oncology

## 2019-02-23 ENCOUNTER — Other Ambulatory Visit: Payer: Self-pay

## 2019-02-23 VITALS — BP 158/97 | HR 92 | Temp 97.3°F | Ht 75.0 in | Wt 163.0 lb

## 2019-02-23 DIAGNOSIS — N400 Enlarged prostate without lower urinary tract symptoms: Secondary | ICD-10-CM | POA: Insufficient documentation

## 2019-02-23 DIAGNOSIS — G629 Polyneuropathy, unspecified: Secondary | ICD-10-CM | POA: Insufficient documentation

## 2019-02-23 DIAGNOSIS — Z833 Family history of diabetes mellitus: Secondary | ICD-10-CM | POA: Diagnosis not present

## 2019-02-23 DIAGNOSIS — R221 Localized swelling, mass and lump, neck: Secondary | ICD-10-CM | POA: Diagnosis not present

## 2019-02-23 DIAGNOSIS — I10 Essential (primary) hypertension: Secondary | ICD-10-CM | POA: Insufficient documentation

## 2019-02-23 DIAGNOSIS — Z8349 Family history of other endocrine, nutritional and metabolic diseases: Secondary | ICD-10-CM | POA: Insufficient documentation

## 2019-02-23 DIAGNOSIS — J439 Emphysema, unspecified: Secondary | ICD-10-CM | POA: Diagnosis not present

## 2019-02-23 DIAGNOSIS — F17210 Nicotine dependence, cigarettes, uncomplicated: Secondary | ICD-10-CM | POA: Insufficient documentation

## 2019-02-23 DIAGNOSIS — Z8249 Family history of ischemic heart disease and other diseases of the circulatory system: Secondary | ICD-10-CM | POA: Diagnosis not present

## 2019-02-23 NOTE — Progress Notes (Signed)
Patient is here today to establish care for soft tissue mass of the neck. Patient went to the ED on 02/21/2019 due to having neck pain. Patient stated that he was going to see the ENT doctor tomorrow.

## 2019-02-24 ENCOUNTER — Encounter: Payer: Self-pay | Admitting: Oncology

## 2019-02-24 NOTE — Progress Notes (Signed)
Hematology/Oncology Consult note [facility] [phone] [phone]  Patient Care Team: Patient, No Pcp Per as PCP - General (General Practice)   Name of the patient: Tyler Holloway  [id number]  [birth year]    Reason for referral-neck mass   Referring Harold   Date of visit: 02/24/19   History of presenting illness-patient is a 59 year old African-American male who went to the ER on 02/21/2019 with symptoms of sore throat and difficulty swallowing.Here a CT soft tissue neck with contrast in the ER which showed an asymmetric polypoid soft tissue position at the base of left aryepiglottic fold measuring 2.9 x 1.3 cm with partial involvement of the adjacent piriform sinus.  Preepiglottic space is preserved.  No pathologically enlarged lymph nodes in the neck.  Patient states that his symptoms have been ongoing for the last 3 to 4 months.  He is able to swallow but reports occasional pain during swallowing.  Denies any unintentional weight loss.  He is a current smoker and smokes about 1 pack/day and has been doing so over the last 45 years.  He also drinks occasional alcohol.  ECOG PS- 0  Pain scale- 2   Review of systems- Review of Systems  Constitutional: Negative for chills, fever, malaise/fatigue and weight loss.  HENT: Positive for sore throat. Negative for congestion, ear discharge and nosebleeds.   Eyes: Negative for blurred vision.  Respiratory: Negative for cough, hemoptysis, sputum production, shortness of breath and wheezing.   Cardiovascular: Negative for chest pain, palpitations, orthopnea and claudication.  Gastrointestinal: Negative for abdominal pain, blood in stool, constipation, diarrhea, heartburn, melena, nausea and vomiting.       Pain during swallowing  Genitourinary: Negative for dysuria, flank pain, frequency, hematuria and urgency.  Musculoskeletal: Negative for back pain, joint pain and myalgias.   Skin: Negative for rash.  Neurological: Negative for dizziness, tingling, focal weakness, seizures, weakness and headaches.  Endo/Heme/Allergies: Does not bruise/bleed easily.  Psychiatric/Behavioral: Negative for depression and suicidal ideas. The patient does not have insomnia.     Allergies  Allergen Reactions  . Pollen Extract Other (See Comments)    sneezing    Patient Active Problem List   Diagnosis Date Noted  . Hepatitis B infection without delta agent without hepatic coma 09/11/2015  . HCV antibody positive 04/26/2015  . Peripheral neuropathy 02/14/2015  . Benign prostatic hyperplasia 02/14/2015     Past Medical History:  Diagnosis Date  . Hypertension      Past Surgical History:  Procedure Laterality Date  . NO PAST SURGERIES      Social History   Socioeconomic History  . Marital status: Single    Spouse name: Not on file  . Number of children: Not on file  . Years of education: Not on file  . Highest education level: Not on file  Occupational History  . Not on file  Tobacco Use  . Smoking status: Current Every Day Smoker    Packs/day: 1.00    Years: 45.00    Pack years: 45.00    Types: Cigarettes  . Smokeless tobacco: Current User  Substance and Sexual Activity  . Alcohol use: Yes  . Drug use: Not on file  . Sexual activity: Not on file  Other Topics Concern  . Not on file  Social History Narrative  . Not on file   Social Determinants of Health   Financial Resource Strain:   . Difficulty of Paying Living Expenses: Not on file  Food  Insecurity:   . Worried About Charity fundraiser in the Last Year: Not on file  . Ran Out of Food in the Last Year: Not on file  Transportation Needs:   . Lack of Transportation (Medical): Not on file  . Lack of Transportation (Non-Medical): Not on file  Physical Activity:   . Days of Exercise per Week: Not on file  . Minutes of Exercise per Session: Not on file  Stress:   . Feeling of Stress : Not on  file  Social Connections:   . Frequency of Communication with Friends and Family: Not on file  . Frequency of Social Gatherings with Friends and Family: Not on file  . Attends Religious Services: Not on file  . Active Member of Clubs or Organizations: Not on file  . Attends Archivist Meetings: Not on file  . Marital Status: Not on file  Intimate Partner Violence:   . Fear of Current or Ex-Partner: Not on file  . Emotionally Abused: Not on file  . Physically Abused: Not on file  . Sexually Abused: Not on file     Family History  Problem Relation Age of Onset  . Diabetes Mellitus I Mother   . Pancreatic cancer Mother   . Stroke Mother   . Heart Problems Sister   . Thyroid disease Sister   . Hypertension Sister   . Cervical cancer Sister      Current Outpatient Medications:  .  oxyCODONE-acetaminophen (PERCOCET) 5-325 MG tablet, Take 1 tablet by mouth every 4 (four) hours as needed for severe pain., Disp: 20 tablet, Rfl: 0 .  triamcinolone ointment (KENALOG) 0.5 %, Apply 1 application topically 2 (two) times daily., Disp: 30 g, Rfl: 0   Physical exam:  Vitals:   02/23/19 1356  BP: (!) 158/97  Pulse: 92  Temp: (!) 97.3 F (36.3 C)  TempSrc: Tympanic  Weight: 163 lb (73.9 kg)  Height: 6\' 3"  (1.905 m)   Physical Exam HENT:     Head: Normocephalic and atraumatic.     Mouth/Throat:     Mouth: Mucous membranes are moist.     Pharynx: Oropharynx is clear.  Eyes:     Pupils: Pupils are equal, round, and reactive to light.  Cardiovascular:     Rate and Rhythm: Normal rate and regular rhythm.     Heart sounds: Normal heart sounds.  Pulmonary:     Effort: Pulmonary effort is normal.     Breath sounds: Normal breath sounds.  Abdominal:     General: Bowel sounds are normal.     Palpations: Abdomen is soft.  Musculoskeletal:     Cervical back: Normal range of motion.  Lymphadenopathy:     Comments: No palpable cervical adenopathy but there is soft tissue  fullness noted in the right submandibular region  Skin:    General: Skin is warm and dry.  Neurological:     Mental Status: He is alert and oriented to person, place, and time.        CMP Latest Ref Rng & Units 02/21/2019  Glucose 70 - 99 mg/dL 88  BUN 6 - 20 mg/dL 13  Creatinine 0.61 - 1.24 mg/dL 1.20  Sodium 135 - 145 mmol/L 140  Potassium 3.5 - 5.1 mmol/L 4.0  Chloride 98 - 111 mmol/L 106  CO2 22 - 32 mmol/L 27  Calcium 8.9 - 10.3 mg/dL 9.1  Total Protein 6.0 - 8.5 g/dL -  Total Bilirubin 0.0 - 1.2 mg/dL -  Alkaline Phos 39 - 117 IU/L -  AST 0 - 40 IU/L -  ALT 0 - 44 IU/L -   CBC Latest Ref Rng & Units 09/11/2015  WBC 3.4 - 10.8 x10E3/uL 3.2(L)  Hemoglobin 12.6 - 17.7 g/dL 14.4  Hematocrit 37.5 - 51.0 % 42.8  Platelets 150 - 399 K/L -    No images are attached to the encounter.  CT Soft Tissue Neck W Contrast  Result Date: 02/21/2019 CLINICAL DATA:  Initial evaluation for acute sore throat for 1 month. EXAM: CT NECK WITH CONTRAST TECHNIQUE: Multidetector CT imaging of the neck was performed using the standard protocol following the bolus administration of intravenous contrast. CONTRAST:  30mL OMNIPAQUE IOHEXOL 300 MG/ML  SOLN COMPARISON:  None available. FINDINGS: Pharynx and larynx: Small volume retained secretions seen within the oral cavity. Oral cavity otherwise within normal limits without abnormality. No visible tongue base mass. Palatine tonsils symmetric and within normal limits. Parapharyngeal fat maintained. Nasopharynx within normal limits. No retropharyngeal collection. Epiglottis within normal limits. There is asymmetric polypoid soft tissue positioned at the base of the left aryepiglottic fold measuring approximately 2.9 x 1.3 cm (series 2, image 64). Partial involvement of the adjacent left piriform sinus. Pre epiglottic fat is preserved. Small volume layering secretions noted within the adjacent hypopharynx. Glottis is opposed inferiorly and not well assessed.  Subglottic airway clear. Salivary glands: Salivary glands including the parotid glands and submandibular glands are within normal limits. Thyroid: Thyroid within normal limits. Lymph nodes: No pathologically enlarged lymph nodes seen within the neck. Vascular: Normal intravascular enhancement seen throughout the neck. Moderate atherosclerotic change about the aortic arch, carotid bifurcations, and carotid siphons. Limited intracranial: Unremarkable. Visualized orbits: Visualized globes and orbital soft tissues within normal limits. Mastoids and visualized paranasal sinuses: Mild mucosal thickening noted within the maxillary sinuses. Visualized paranasal sinuses are otherwise clear. Visualize mastoids and middle ear cavities are well pneumatized and free of fluid. Skeleton: No acute osseous abnormality. No discrete or worrisome osseous lesions. Moderate cervical spondylosis, most notable at C5-6 and C6-7. Upper chest: Visualized upper chest demonstrates no acute finding. Centrilobular and paraseptal emphysematous changes noted within the visualized lungs. Other: None. IMPRESSION: 1. Apparent 2.9 x 1.3 cm polypoid soft tissue mass positioned at the base of the left aryepiglottic fold, indeterminate, but could reflect a primary head and neck malignancy. ENT referral for further evaluation and direct visualization recommended. 2. No other acute abnormality within the neck.  No adenopathy. 3. Aortic Atherosclerosis (ICD10-I70.0) and Emphysema (ICD10-J43.9). Electronically Signed   By: Jeannine Boga M.D.   On: 02/21/2019 03:15    Assessment and plan- Patient is a 59 y.o. male referred by ER for neck mass  I have reviewed CT soft tissue neck images independently and discussed findings with the patient.  Patient found to have a 2.9 x 1.3 cm mass at the base of the left aryepiglottic fold.  I cannot visualize this mass on oral exam.  There is a soft tissue fullness noted in the right submandibular region  posteriorly.  Patient will be seeing ENT tomorrow and I will await further evaluation by them followed by possible biopsy.  If biopsy findings are concerning for malignancy I will see him for future appointments.   Thank you for this kind referral and the opportunity to participate in the care of this patient   Visit Diagnosis 1. Neck mass     Dr. Randa Evens, MD, MPH [facility] [id number] 02/24/2019

## 2019-02-27 ENCOUNTER — Encounter: Payer: Self-pay | Admitting: Otolaryngology

## 2019-02-28 ENCOUNTER — Other Ambulatory Visit: Payer: Self-pay

## 2019-02-28 ENCOUNTER — Other Ambulatory Visit
Admission: RE | Admit: 2019-02-28 | Discharge: 2019-02-28 | Disposition: A | Discharge status: Home / Self Care | Payer: BC Managed Care – PPO | Source: Ambulatory Visit | Attending: Otolaryngology | Admitting: Otolaryngology

## 2019-02-28 DIAGNOSIS — Z01812 Encounter for preprocedural laboratory examination: Secondary | ICD-10-CM | POA: Insufficient documentation

## 2019-02-28 DIAGNOSIS — Z20822 Contact with and (suspected) exposure to covid-19: Secondary | ICD-10-CM | POA: Insufficient documentation

## 2019-02-28 LAB — SARS CORONAVIRUS 2 (TAT 6-24 HRS): SARS Coronavirus 2: NEGATIVE

## 2019-03-02 ENCOUNTER — Ambulatory Visit: Payer: BC Managed Care – PPO | Admitting: Anesthesiology

## 2019-03-02 ENCOUNTER — Encounter: Payer: Self-pay | Admitting: Otolaryngology

## 2019-03-02 ENCOUNTER — Ambulatory Visit
Admission: RE | Admit: 2019-03-02 | Discharge: 2019-03-02 | Disposition: A | Discharge status: Home / Self Care | Payer: BC Managed Care – PPO | Attending: Otolaryngology | Admitting: Otolaryngology

## 2019-03-02 ENCOUNTER — Encounter
Admission: RE | Disposition: A | Discharge status: Home / Self Care | Payer: Self-pay | Source: Home / Self Care | Attending: Otolaryngology

## 2019-03-02 ENCOUNTER — Other Ambulatory Visit: Payer: Self-pay

## 2019-03-02 DIAGNOSIS — F17200 Nicotine dependence, unspecified, uncomplicated: Secondary | ICD-10-CM | POA: Insufficient documentation

## 2019-03-02 DIAGNOSIS — R1310 Dysphagia, unspecified: Secondary | ICD-10-CM | POA: Diagnosis present

## 2019-03-02 DIAGNOSIS — J387 Other diseases of larynx: Secondary | ICD-10-CM | POA: Diagnosis present

## 2019-03-02 DIAGNOSIS — I10 Essential (primary) hypertension: Secondary | ICD-10-CM | POA: Insufficient documentation

## 2019-03-02 DIAGNOSIS — B1920 Unspecified viral hepatitis C without hepatic coma: Secondary | ICD-10-CM | POA: Diagnosis not present

## 2019-03-02 DIAGNOSIS — G709 Myoneural disorder, unspecified: Secondary | ICD-10-CM | POA: Insufficient documentation

## 2019-03-02 DIAGNOSIS — C323 Malignant neoplasm of laryngeal cartilage: Secondary | ICD-10-CM | POA: Diagnosis not present

## 2019-03-02 HISTORY — PX: RIGID ESOPHAGOSCOPY: SHX5226

## 2019-03-02 HISTORY — PX: LARYNGOSCOPY: SHX5203

## 2019-03-02 SURGERY — LARYNGOSCOPY
Anesthesia: General | Site: Throat | Laterality: Bilateral

## 2019-03-02 MED ORDER — ACETAMINOPHEN 160 MG/5ML PO SOLN: 325.0000 mg | ORAL | Status: DC | PRN

## 2019-03-02 MED ORDER — PHENYLEPHRINE HCL 0.5 % NA SOLN: NASAL | Status: DC | PRN

## 2019-03-02 MED ORDER — ACETAMINOPHEN 325 MG PO TABS: 325.0000 mg | ORAL_TABLET | ORAL | Status: DC | PRN

## 2019-03-02 MED ORDER — SUCCINYLCHOLINE CHLORIDE 20 MG/ML IJ SOLN
INTRAMUSCULAR | Status: DC | PRN
  Administered 2019-03-02: 80 mg via INTRAVENOUS

## 2019-03-02 MED ORDER — LACTATED RINGERS IV SOLN
100.0000 mL/h | INTRAVENOUS | Status: DC
  Administered 2019-03-02: 07:00:00 100 mL/h via INTRAVENOUS

## 2019-03-02 MED ORDER — DEXAMETHASONE SODIUM PHOSPHATE 4 MG/ML IJ SOLN
INTRAMUSCULAR | Status: DC | PRN
  Administered 2019-03-02: 4 mg via INTRAVENOUS

## 2019-03-02 MED ORDER — OXYCODONE HCL 5 MG/5ML PO SOLN: 5.0000 mg | Freq: Once | ORAL | Status: DC | PRN

## 2019-03-02 MED ORDER — MIDAZOLAM HCL 5 MG/5ML IJ SOLN
INTRAMUSCULAR | Status: DC | PRN
  Administered 2019-03-02: 2 mg via INTRAVENOUS

## 2019-03-02 MED ORDER — FENTANYL CITRATE (PF) 100 MCG/2ML IJ SOLN
INTRAMUSCULAR | Status: DC | PRN
  Administered 2019-03-02 (×2): 50 ug via INTRAVENOUS

## 2019-03-02 MED ORDER — GLYCOPYRROLATE 0.2 MG/ML IJ SOLN
INTRAMUSCULAR | Status: DC | PRN
  Administered 2019-03-02: .1 mg via INTRAVENOUS

## 2019-03-02 MED ORDER — ONDANSETRON HCL 4 MG/2ML IJ SOLN
INTRAMUSCULAR | Status: DC | PRN
  Administered 2019-03-02: 4 mg via INTRAVENOUS

## 2019-03-02 MED ORDER — OXYCODONE-ACETAMINOPHEN 10-325 MG PO TABS: 1.0000 | ORAL_TABLET | Freq: Four times a day (QID) | ORAL | 0 refills | Status: AC | PRN

## 2019-03-02 MED ORDER — ONDANSETRON HCL 4 MG/2ML IJ SOLN: 4.0000 mg | Freq: Once | INTRAMUSCULAR | Status: DC | PRN

## 2019-03-02 MED ORDER — LIDOCAINE HCL (CARDIAC) PF 100 MG/5ML IV SOSY
PREFILLED_SYRINGE | INTRAVENOUS | Status: DC | PRN
  Administered 2019-03-02: 50 mg via INTRAVENOUS

## 2019-03-02 MED ORDER — FENTANYL CITRATE (PF) 100 MCG/2ML IJ SOLN: 25.0000 ug | INTRAMUSCULAR | Status: DC | PRN

## 2019-03-02 MED ORDER — PROPOFOL 10 MG/ML IV BOLUS
INTRAVENOUS | Status: DC | PRN
  Administered 2019-03-02: 150 mg via INTRAVENOUS
  Administered 2019-03-02: 50 mg via INTRAVENOUS

## 2019-03-02 MED ORDER — OXYCODONE HCL 5 MG PO TABS: 5.0000 mg | ORAL_TABLET | Freq: Once | ORAL | Status: DC | PRN

## 2019-03-02 SURGICAL SUPPLY — 20 items
BASIN GRAD PLASTIC 32OZ STRL (MISCELLANEOUS) ×2 IMPLANT
BLOCK BITE GUARD (MISCELLANEOUS) ×3 IMPLANT
COVER MAYO STAND STRL (DRAPES) ×3 IMPLANT
COVER TABLE BACK 60X90 (DRAPES) ×3 IMPLANT
CUP MEDICINE 2OZ PLAST GRAD ST (MISCELLANEOUS) ×2 IMPLANT
DRAPE SHEET LG 3/4 BI-LAMINATE (DRAPES) ×3 IMPLANT
DRSG TELFA 4X3 1S NADH ST (GAUZE/BANDAGES/DRESSINGS) ×3 IMPLANT
GAUZE 4X4 16PLY RFD (DISPOSABLE) ×2 IMPLANT
GLOVE PI ULTRA LF STRL 7.5 (GLOVE) ×1 IMPLANT
GLOVE PI ULTRA NON LATEX 7.5 (GLOVE) ×2
KIT TURNOVER KIT A (KITS) ×3 IMPLANT
MARKER SKIN DUAL TIP RULER LAB (MISCELLANEOUS) ×2 IMPLANT
NDL 18GX1X1/2 (RX/OR ONLY) (NEEDLE) IMPLANT
NEEDLE 18GX1X1/2 (RX/OR ONLY) (NEEDLE) ×3 IMPLANT
PATTIES SURGICAL .5 X.5 (GAUZE/BANDAGES/DRESSINGS) ×3 IMPLANT
STRAP BODY AND KNEE 60X3 (MISCELLANEOUS) ×3 IMPLANT
SURGILUBE 3G PEEL PACK STRL (MISCELLANEOUS) ×4 IMPLANT
TOWEL OR 17X26 4PK STRL BLUE (TOWEL DISPOSABLE) ×3 IMPLANT
TUBING CONN 6MMX3.1M (TUBING) ×2
TUBING SUCTION CONN 0.25 STRL (TUBING) ×1 IMPLANT

## 2019-03-02 NOTE — H&P (Signed)
H&P has been reviewed and patient reevaluated, no changes necessary. To be downloaded later.  

## 2019-03-02 NOTE — Transfer of Care (Signed)
Immediate Anesthesia Transfer of Care Note  Patient: Kathleene Hazel Concannon  Procedure(s) Performed: DIRECT LARYNGOSCOPY W/ BX. (Bilateral Throat) RIGID ESOPHAGOSCOPY RIGID BRONCOSCOPY (Bilateral Throat)  Patient Location: PACU  Anesthesia Type: General ETT  Level of Consciousness: awake, alert  and patient cooperative  Airway and Oxygen Therapy: Patient Spontanous Breathing and Patient connected to supplemental oxygen  Post-op Assessment: Post-op Vital signs reviewed, Patient's Cardiovascular Status Stable, Respiratory Function Stable, Patent Airway and No signs of Nausea or vomiting  Post-op Vital Signs: Reviewed and stable  Complications: No apparent anesthesia complications

## 2019-03-02 NOTE — Anesthesia Postprocedure Evaluation (Signed)
Anesthesia Post Note  Patient: Tyler Holloway  Procedure(s) Performed: DIRECT LARYNGOSCOPY W/ BX. (Bilateral Throat) RIGID ESOPHAGOSCOPY RIGID BRONCOSCOPY (Bilateral Throat)     Patient location during evaluation: PACU Anesthesia Type: General Level of consciousness: awake and alert and oriented Pain management: satisfactory to patient Vital Signs Assessment: post-procedure vital signs reviewed and stable Respiratory status: spontaneous breathing, nonlabored ventilation and respiratory function stable Cardiovascular status: blood pressure returned to baseline and stable Postop Assessment: Adequate PO intake and No signs of nausea or vomiting Anesthetic complications: no    Raliegh Ip

## 2019-03-02 NOTE — Anesthesia Procedure Notes (Signed)
Procedure Name: Intubation Date/Time: 03/02/2019 7:46 AM Performed by: Cameron Ali, CRNA Pre-anesthesia Checklist: Patient identified, Emergency Drugs available, Suction available, Patient being monitored and Timeout performed Patient Re-evaluated:Patient Re-evaluated prior to induction Oxygen Delivery Method: Circle system utilized Preoxygenation: Pre-oxygenation with 100% oxygen Induction Type: IV induction Ventilation: Mask ventilation without difficulty Laryngoscope Size: Mac and 3 Grade View: Grade II Tube type: MLT Tube size: 6.0 mm Number of attempts: 1 Placement Confirmation: ETT inserted through vocal cords under direct vision,  positive ETCO2 and breath sounds checked- equal and bilateral Tube secured with: Tape Dental Injury: Teeth and Oropharynx as per pre-operative assessment  Comments: Right upper front tooth very loose. Remains intact after intubation. Surgeon made aware.

## 2019-03-02 NOTE — Anesthesia Preprocedure Evaluation (Addendum)
Anesthesia Evaluation  Patient identified by MRN, date of birth, ID band Patient awake    Reviewed: Allergy & Precautions, H&P , NPO status , Patient's Chart, lab work & pertinent test results  Airway Mallampati: II  TM Distance: >3 FB Neck ROM: full    Dental  (+) Missing, Loose   Pulmonary Current Smoker,    Pulmonary exam normal breath sounds clear to auscultation       Cardiovascular hypertension, Normal cardiovascular exam Rhythm:regular Rate:Normal     Neuro/Psych  Neuromuscular disease    GI/Hepatic (+) Hepatitis -, C  Endo/Other    Renal/GU      Musculoskeletal   Abdominal   Peds  Hematology   Anesthesia Other Findings   Reproductive/Obstetrics                            Anesthesia Physical Anesthesia Plan  ASA: III  Anesthesia Plan: General ETT   Post-op Pain Management:    Induction:   PONV Risk Score and Plan: 2 and Treatment may vary due to age or medical condition, Ondansetron and Dexamethasone  Airway Management Planned:   Additional Equipment:   Intra-op Plan:   Post-operative Plan:   Informed Consent: I have reviewed the patients History and Physical, chart, labs and discussed the procedure including the risks, benefits and alternatives for the proposed anesthesia with the patient or authorized representative who has indicated his/her understanding and acceptance.     Dental Advisory Given  Plan Discussed with: CRNA  Anesthesia Plan Comments:         Anesthesia Quick Evaluation

## 2019-03-02 NOTE — Discharge Instructions (Signed)
General Anesthesia, Adult, Care After This sheet gives you information about how to care for yourself after your procedure. Your health care provider may also give you more specific instructions. If you have problems or questions, contact your health care provider. What can I expect after the procedure? After the procedure, the following side effects are common:  Pain or discomfort at the IV site.  Nausea.  Vomiting.  Sore throat.  Trouble concentrating.  Feeling cold or chills.  Weak or tired.  Sleepiness and fatigue.  Soreness and body aches. These side effects can affect parts of the body that were not involved in surgery. Follow these instructions at home:  For at least 24 hours after the procedure:  Have a responsible adult stay with you. It is important to have someone help care for you until you are awake and alert.  Rest as needed.  Do not: ? Participate in activities in which you could fall or become injured. ? Drive. ? Use heavy machinery. ? Drink alcohol. ? Take sleeping pills or medicines that cause drowsiness. ? Make important decisions or sign legal documents. ? Take care of children on your own. Eating and drinking  Follow any instructions from your health care provider about eating or drinking restrictions.  When you feel hungry, start by eating small amounts of foods that are soft and easy to digest (bland), such as toast. Gradually return to your regular diet.  Drink enough fluid to keep your urine pale yellow.  If you vomit, rehydrate by drinking water, juice, or clear broth. General instructions  If you have sleep apnea, surgery and certain medicines can increase your risk for breathing problems. Follow instructions from your health care provider about wearing your sleep device: ? Anytime you are sleeping, including during daytime naps. ? While taking prescription pain medicines, sleeping medicines, or medicines that make you drowsy.  Return to  your normal activities as told by your health care provider. Ask your health care provider what activities are safe for you.  Take over-the-counter and prescription medicines only as told by your health care provider.  If you smoke, do not smoke without supervision.  Keep all follow-up visits as told by your health care provider. This is important. Contact a health care provider if:  You have nausea or vomiting that does not get better with medicine.  You cannot eat or drink without vomiting.  You have pain that does not get better with medicine.  You are unable to pass urine.  You develop a skin rash.  You have a fever.  You have redness around your IV site that gets worse. Get help right away if:  You have difficulty breathing.  You have chest pain.  You have blood in your urine or stool, or you vomit blood. Summary  After the procedure, it is common to have a sore throat or nausea. It is also common to feel tired.  Have a responsible adult stay with you for the first 24 hours after general anesthesia. It is important to have someone help care for you until you are awake and alert.  When you feel hungry, start by eating small amounts of foods that are soft and easy to digest (bland), such as toast. Gradually return to your regular diet.  Drink enough fluid to keep your urine pale yellow.  Return to your normal activities as told by your health care provider. Ask your health care provider what activities are safe for you. This information is not   intended to replace advice given to you by your health care provider. Make sure you discuss any questions you have with your health care provider. Document Revised: 01/15/2017 Document Reviewed: 08/28/2016 Elsevier Patient Education  2020 [employer]  

## 2019-03-02 NOTE — Op Note (Signed)
03/02/2019  8:35 AM    Tyler Holloway  [id number]   Pre-Op Dx: Left arytenoid lesion and dysphagia  Post-op Dx: Same  Proc: Direct microlaryngoscopy with biopsy of left arytenoid lesion, esophagoscopy, bronchoscopy  Surg:  Tyler Holloway Tyler Holloway  Anes:  GOT  EBL: 15 mL  Comp: None  Findings: He had a growth on the posterior lateral border of the left arytenoid.  This was approximately 12 to 14 mm in size.  It is firm and round and growing out from the lateral posterior border.  Much of it was biopsied away.  Procedure: The patient was brought to the operating room and given general anesthesia by oral endotracheal intubation.  Once the patient was asleep a Dedo laryngoscope was used for visualization of the hypopharynx and larynx.  The epiglottis looked normal and the vallecula was clear.  The vocal cords look normal and the right piriform sinus was clear.  The left piriform sinus showed the lesion on the lateral wall of the arytenoid and larynx.  The depth of the piriform sinus was clear.  Mass was less than 1-1/2 cm and was slightly wider than it was long.  It was firm and on the lateral border of the arytenoid.  There is some edema of the area epiglottic fold.  The 10 x 14 mm esophagoscope was used to visualize the esophageal opening in the upper esophagus.  There was no sign of any lesions.  The mucosa was healthy and the esophagus was open down to about one half of the way down.  The scope was removed slowly and the mucosa was visualized in 360 degrees and there were no lesions anywhere.  A size 6.5 mm bronchoscope was used for visualizing the tracheobronchial tree.  This was passed under direct vision anterior to the oral endotracheal tube.  As I got below the cords the cuff was deflated so I could pass past the end of the tube and visualized the rest of the trachea.  There were healthy tracheal rings with no sign of any lesions in the trachea that I could tell.  The the scope was then pulled  back and the cuff inflated beneath the cords, and then the scope was completely removed with the endotracheal tube in the correct position.  The Dedo laryngoscope was then used to revisualize the lesion on the left arytenoid.  The Lewy arm was brought in for stabilization.  Magnification was done with the endoscopes.  Pictures were taken of this prior to biopsyl and then again after biopsy was done.  A straight medium sized laryngeal cup biting forceps was used for biopsy and away much of the lesion.  It was quite firm with some watery edema of the mucosa around it.  There is some ooze that was controlled with cotton pledgets soaked in lidocaine and phenylephrine.  These were allowed to sit for a few minutes and removed and the bleeding was very minimal.  The patient was then awakened and taken to the recovery room in satisfactory condition.  There were no operative complications.  Dispo:   To PACU to be discharged home  Plan: To follow-up in the office in 5 or 6 days to go over the path report and decide upon a treatment plan.  He will start with liquids at home and increase his diet as tolerated.  I have written for Percocets to use for pain as needed in the postop.  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  He will call the office if he has any problems  Tyler Holloway  03/02/2019 8:35 AM

## 2019-03-03 ENCOUNTER — Encounter: Payer: Self-pay | Admitting: *Deleted

## 2019-03-06 ENCOUNTER — Other Ambulatory Visit: Payer: Self-pay | Admitting: Otolaryngology

## 2019-03-06 LAB — SURGICAL PATHOLOGY

## 2019-03-09 ENCOUNTER — Other Ambulatory Visit: Payer: Self-pay

## 2019-03-09 ENCOUNTER — Other Ambulatory Visit: Payer: BC Managed Care – PPO

## 2019-03-09 DIAGNOSIS — R221 Localized swelling, mass and lump, neck: Secondary | ICD-10-CM

## 2019-03-09 NOTE — Progress Notes (Signed)
Tumor Board Documentation  Lannis Heimbuch Ramseur was presented by Dr Janese Banks at our Tumor Board on 03/09/2019, which included representatives from medical oncology, radiation oncology, navigation, pathology, radiology, surgical, surgical oncology, internal medicine, pharmacy, genetics, palliative care, research.  Teandre currently presents as a new patient, for [place], for new positive pathology with history of the following treatments: surgical intervention(s), active survellience.  Additionally, we reviewed previous medical and familial history, history of present illness, and recent lab results along with all available histopathologic and imaging studies. The tumor board considered available treatment options and made the following recommendations: Radiation therapy (primary modality), Additional screening PET Scan  The following procedures/referrals were also placed: No orders of the defined types were placed in this encounter.   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  Tumor board is a meeting of clinicians from various specialty areas who evaluate and discuss patients for whom a multidisciplinary approach is being considered. Final determinations in the plan of care are those of the provider(s). The responsibility for follow up of recommendations given during tumor board is that of the provider.   Today's extended care, comprehensive team conference, Asher was not present for the discussion and was not examined.   Multidisciplinary Tumor Board is a multidisciplinary case peer review process.  Decisions discussed in the Multidisciplinary Tumor Board reflect the opinions of the specialists present at the conference without having examined the patient.  Ultimately, treatment and diagnostic decisions rest with the  primary provider(s) and the patient.

## 2019-03-13 ENCOUNTER — Other Ambulatory Visit: Payer: Self-pay

## 2019-03-14 ENCOUNTER — Inpatient Hospital Stay: Payer: BC Managed Care – PPO | Attending: Oncology | Admitting: Oncology

## 2019-03-14 ENCOUNTER — Other Ambulatory Visit: Payer: Self-pay

## 2019-03-14 ENCOUNTER — Ambulatory Visit: Payer: BC Managed Care – PPO | Admitting: Oncology

## 2019-03-14 ENCOUNTER — Encounter: Payer: Self-pay | Admitting: Oncology

## 2019-03-14 ENCOUNTER — Ambulatory Visit
Admission: RE | Admit: 2019-03-14 | Discharge: 2019-03-14 | Disposition: A | Discharge status: Home / Self Care | Payer: BC Managed Care – PPO | Source: Ambulatory Visit | Attending: Radiation Oncology | Admitting: Radiation Oncology

## 2019-03-14 VITALS — BP 155/99 | HR 67 | Temp 97.1°F | Wt 159.5 lb

## 2019-03-14 DIAGNOSIS — G629 Polyneuropathy, unspecified: Secondary | ICD-10-CM | POA: Diagnosis not present

## 2019-03-14 DIAGNOSIS — C07 Malignant neoplasm of parotid gland: Secondary | ICD-10-CM | POA: Insufficient documentation

## 2019-03-14 DIAGNOSIS — Z800 Family history of malignant neoplasm of digestive organs: Secondary | ICD-10-CM | POA: Insufficient documentation

## 2019-03-14 DIAGNOSIS — G893 Neoplasm related pain (acute) (chronic): Secondary | ICD-10-CM

## 2019-03-14 DIAGNOSIS — B1910 Unspecified viral hepatitis B without hepatic coma: Secondary | ICD-10-CM | POA: Diagnosis not present

## 2019-03-14 DIAGNOSIS — F1010 Alcohol abuse, uncomplicated: Secondary | ICD-10-CM | POA: Insufficient documentation

## 2019-03-14 DIAGNOSIS — I10 Essential (primary) hypertension: Secondary | ICD-10-CM | POA: Diagnosis not present

## 2019-03-14 DIAGNOSIS — C321 Malignant neoplasm of supraglottis: Secondary | ICD-10-CM | POA: Insufficient documentation

## 2019-03-14 DIAGNOSIS — Z8049 Family history of malignant neoplasm of other genital organs: Secondary | ICD-10-CM | POA: Insufficient documentation

## 2019-03-14 DIAGNOSIS — Z7189 Other specified counseling: Secondary | ICD-10-CM

## 2019-03-14 DIAGNOSIS — Z8349 Family history of other endocrine, nutritional and metabolic diseases: Secondary | ICD-10-CM | POA: Insufficient documentation

## 2019-03-14 DIAGNOSIS — Z8249 Family history of ischemic heart disease and other diseases of the circulatory system: Secondary | ICD-10-CM | POA: Insufficient documentation

## 2019-03-14 DIAGNOSIS — Z833 Family history of diabetes mellitus: Secondary | ICD-10-CM | POA: Diagnosis not present

## 2019-03-14 DIAGNOSIS — F17210 Nicotine dependence, cigarettes, uncomplicated: Secondary | ICD-10-CM | POA: Insufficient documentation

## 2019-03-14 NOTE — Progress Notes (Signed)
Patient stated that he had been having throat pain and that percocet is not taking care of the pain. Patient stated that Dr. Kathyrn Sheriff gave him a strong medication (Percocet 10-325 MG QID as needed). Patient also stated that he's been having poor appetite since he has pain when he eats.

## 2019-03-14 NOTE — Consult Note (Signed)
NEW PATIENT EVALUATION  Name: Tyler Holloway  MRN: [id number]  Date:   03/14/2019     DOB: Oct 17, 1960   This 58 y.o. 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  REFERRING PHYSICIAN: No ref. provider found  CHIEF COMPLAINT:  Chief Complaint  Patient presents with  . Cancer    Initial consultation    DIAGNOSIS: The encounter diagnosis was Malignant neoplasm of parotid gland ([place]).   PREVIOUS INVESTIGATIONS:  CT scan reviewed PET CT scan ordered Pathology reviewed Clinical notes reviewed  HPI: Patient is a 59 year old male who presented with several month history of sore throat and dysphagia.  Initial CT scan demonstrated a 2.9 x 1.3 polypoid soft tissue mass at the base of the left aryepiglottic fold.  No evidence of adenopathy in the neck was identified.  Patient was seen by ENT and underwent direct microlaryngoscopy.  He had approximately a 1.2 x 1.4 cm mass in the posterior lateral border of the left arytenoid concerning for malignancy.  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  He has had a PET scan ordered.  He has been seen by medical oncology and is now referred to radiation oncology for consideration of treatment.  Patient continues to have dysphagia and head and neck pain.  Patient is a 50-pack-year smoking history also has social EtOH use history.  His other committed comorbidities include hepatitis B HCV antibody positivity as well as slight peripheral neuropathy.  PLANNED TREATMENT REGIMEN: Based on PET scan either concurrent chemoradiation or radiation as sole modality.  PAST MEDICAL HISTORY:  has a past medical history of Hypertension.    PAST SURGICAL HISTORY:  Past Surgical History:  Procedure Laterality Date  . LARYNGOSCOPY Bilateral 03/02/2019   Procedure: DIRECT LARYNGOSCOPY W/ BX.;  Surgeon: Margaretha Sheffield, MD;  Location: [facility];  Service: ENT;  Laterality: Bilateral;  . NO PAST SURGERIES    . RIGID ESOPHAGOSCOPY Bilateral 03/02/2019   Procedure: RIGID ESOPHAGOSCOPY RIGID BRONCOSCOPY;  Surgeon: Margaretha Sheffield, MD;  Location: [facility];  Service: ENT;  Laterality: Bilateral;    FAMILY HISTORY: family history includes Cervical cancer in his sister; Diabetes Mellitus I in his mother; Heart Problems in his sister; Hypertension in his sister; Pancreatic cancer in his mother; Stroke in his mother; Thyroid disease in his sister.  SOCIAL HISTORY:  reports that he has been smoking cigarettes. He has a 45.00 pack-year smoking history. He uses smokeless tobacco. He reports current alcohol use of about 7.0 standard drinks of alcohol per week.  ALLERGIES: Pollen extract  MEDICATIONS:  Current Outpatient Medications  Medication Sig Dispense Refill  . oxyCODONE-acetaminophen (PERCOCET) 10-325 MG tablet Take 1 tablet by mouth every 6 (six) hours as needed for pain.     No current facility-administered medications for this encounter.    ECOG PERFORMANCE STATUS:  1 - Symptomatic but completely ambulatory  REVIEW OF SYSTEMS: Patient denies any weight loss, fatigue, weakness, fever, chills or night sweats. Patient denies any loss of vision, blurred vision. Patient denies any ringing  of the ears or hearing loss. No irregular heartbeat. Patient denies heart murmur or history of fainting. Patient denies any chest pain or pain radiating to her upper extremities. Patient denies any shortness of breath, difficulty breathing at night, cough or hemoptysis. Patient denies any swelling in the lower legs. Patient denies any nausea vomiting, vomiting of blood, or coffee ground material  in the vomitus. Patient denies any stomach pain. Patient states has had normal bowel movements no significant constipation or diarrhea. Patient denies any dysuria, hematuria or significant nocturia. Patient denies any problems walking, swelling in  the joints or loss of balance. Patient denies any skin changes, loss of hair or loss of weight. Patient denies any excessive worrying or anxiety or significant depression. Patient denies any problems with insomnia. Patient denies excessive thirst, polyuria, polydipsia. Patient denies any swollen glands, patient denies easy bruising or easy bleeding. Patient denies any recent infections, allergies or URI. Patient "s visual fields have not changed significantly in recent time.   PHYSICAL EXAM: There were no vitals taken for this visit. Examination of the neck does not reveal any evidence of cervical or supraclavicular adenopathy.  Well-developed well-nourished patient in NAD. HEENT reveals PERLA, EOMI, discs not visualized.  Oral cavity is clear. No oral mucosal lesions are identified. Neck is clear without evidence of cervical or supraclavicular adenopathy. Lungs are clear to A&P. Cardiac examination is essentially unremarkable with regular rate and rhythm without murmur rub or thrill. Abdomen is benign with no organomegaly or masses noted. Motor sensory and DTR levels are equal and symmetric in the upper and lower extremities. Cranial nerves II through XII are grossly intact. Proprioception is intact. No peripheral adenopathy or edema is identified. No motor or sensory levels are noted. Crude visual fields are within normal range.  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  Should this be locally advanced tumor with disease in his cervical nodes would favor concurrent chemoradiation.  If this is truly a T2 lesion would offer sole modality of radiation therapy.  I would plan on delivering 7000 cGy over 7  weeks treating uninvolved lymph nodes to 5400 cGy using IMRT treatment planning and delivery.  I would choose IMRT radiation to spare critical structures such as a slight salivary glands spinal cord oropharynx.  Risks and benefits of treatment including increased dysphagia skin reaction fatigue alteration of blood counts alteration of taste all were described in detail to the patient.  He seems to comprehend my treatment plan well.  I have tentatively set him up for simulation shortly after the PET scan is complete.  We will review those results with the patient prior to beginning treatment planning.  I would like to take this opportunity to thank you for allowing me to participate in the care of your patient.Noreene Filbert, MD

## 2019-03-15 ENCOUNTER — Other Ambulatory Visit: Payer: Self-pay | Admitting: *Deleted

## 2019-03-15 ENCOUNTER — Other Ambulatory Visit: Payer: Self-pay | Admitting: Oncology

## 2019-03-15 MED ORDER — OXYCODONE HCL 10 MG PO TABS: 15.0000 mg | ORAL_TABLET | Freq: Four times a day (QID) | ORAL | 0 refills | Status: DC | PRN

## 2019-03-15 NOTE — Telephone Encounter (Addendum)
Daughter called reporting that medicine for pain was to be sent to [facility] on [address] for patient. She is not sure if you were going to refill his Oxycodone 10/325 or order something else. Please advise

## 2019-03-16 ENCOUNTER — Telehealth: Payer: Self-pay | Admitting: *Deleted

## 2019-03-16 DIAGNOSIS — C321 Malignant neoplasm of supraglottis: Secondary | ICD-10-CM | POA: Insufficient documentation

## 2019-03-16 DIAGNOSIS — Z7189 Other specified counseling: Secondary | ICD-10-CM | POA: Insufficient documentation

## 2019-03-16 NOTE — Telephone Encounter (Addendum)
Pharmacy was only able to dispense #85 tabs of the 150 tabs of Oxycodone ordered due to patient needing prior authorization for the larger quantity

## 2019-03-16 NOTE — Telephone Encounter (Signed)
Patient's PA was sent through [email or web address]. Waiting on response.

## 2019-03-16 NOTE — Progress Notes (Signed)
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  Preepiglottic space is preserved.  No pathologically enlarged lymph nodes in the neck.  Patient states that his symptoms have been ongoing for the last 3 to 4 months.  He is able to swallow but reports occasional pain during swallowing.  Denies any unintentional weight loss.  He is a current smoker and smokes about 1 pack/day and has been doing so over the last 45 years.  He also drinks occasional alcohol.  Patient placed seen by Dr. Daisy Blossom and underwent an ENT exam.  As per the clinical impression this was a T2 disease.  Biopsy showed squamous cell carcinoma  Interval history- patient reports having significant throat pain. Percocet is not helping much. He is still able to swallow but with difficulty  ECOG PS- 1 Pain scale- 5 Opioid associated constipation- no  Review of systems- Review of Systems  Constitutional: Negative for chills, fever, malaise/fatigue and weight loss.  HENT: Positive for sore throat. Negative for congestion, ear discharge and nosebleeds.        Throat pain  Eyes: Negative for blurred vision.  Respiratory: Negative for cough, hemoptysis, sputum  production, shortness of breath and wheezing.   Cardiovascular: Negative for chest pain, palpitations, orthopnea and claudication.  Gastrointestinal: Negative for abdominal pain, blood in stool, constipation, diarrhea, heartburn, melena, nausea and vomiting.  Genitourinary: Negative for dysuria, flank pain, frequency, hematuria and urgency.  Musculoskeletal: Negative for back pain, joint pain and myalgias.  Skin: Negative for rash.  Neurological: Negative for dizziness, tingling, focal weakness, seizures, weakness and headaches.  Endo/Heme/Allergies: Does not bruise/bleed easily.  Psychiatric/Behavioral: Negative for depression and suicidal ideas. The patient does not have insomnia.        Allergies  Allergen Reactions  . Pollen Extract Other (See Comments)    sneezing     Past Medical History:  Diagnosis Date  . Hypertension      Past Surgical History:  Procedure Laterality Date  . LARYNGOSCOPY Bilateral 03/02/2019   Procedure: DIRECT LARYNGOSCOPY W/ BX.;  Surgeon: Margaretha Sheffield, MD;  Location: [place];  Service: ENT;  Laterality: Bilateral;  . NO PAST SURGERIES    . RIGID ESOPHAGOSCOPY Bilateral 03/02/2019   Procedure: RIGID ESOPHAGOSCOPY RIGID BRONCOSCOPY;  Surgeon: Margaretha Sheffield, MD;  Location: [place];  Service: ENT;  Laterality: Bilateral;    Social History   Socioeconomic History  . Marital status: Single    Spouse name: Not on file  . Number of children: Not on file  . Years of education: Not on file  . Highest education level: Not on file  Occupational History  . Not on file  Tobacco Use  . Smoking status: Current Every Day Smoker    Packs/day: 1.00    Years: 45.00  Pack years: 45.00    Types: Cigarettes  . Smokeless tobacco: Current User  Substance and Sexual Activity  . Alcohol use: Yes    Alcohol/week: 7.0 standard drinks    Types: 7 Cans of beer per week  . Drug use: Not on file  . Sexual activity: Not on file  Other Topics  Concern  . Not on file  Social History Narrative  . Not on file   Social Determinants of Health   Financial Resource Strain:   . Difficulty of Paying Living Expenses: Not on file  Food Insecurity:   . Worried About Charity fundraiser in the Last Year: Not on file  . Ran Out of Food in the Last Year: Not on file  Transportation Needs:   . Lack of Transportation (Medical): Not on file  . Lack of Transportation (Non-Medical): Not on file  Physical Activity:   . Days of Exercise per Week: Not on file  . Minutes of Exercise per Session: Not on file  Stress:   . Feeling of Stress : Not on file  Social Connections:   . Frequency of Communication with Friends and Family: Not on file  . Frequency of Social Gatherings with Friends and Family: Not on file  . Attends Religious Services: Not on file  . Active Member of Clubs or Organizations: Not on file  . Attends Archivist Meetings: Not on file  . Marital Status: Not on file  Intimate Partner Violence:   . Fear of Current or Ex-Partner: Not on file  . Emotionally Abused: Not on file  . Physically Abused: Not on file  . Sexually Abused: Not on file    Family History  Problem Relation Age of Onset  . Diabetes Mellitus I Mother   . Pancreatic cancer Mother   . Stroke Mother   . Heart Problems Sister   . Thyroid disease Sister   . Hypertension Sister   . Cervical cancer Sister      Current Outpatient Medications:  .  Oxycodone HCl 10 MG TABS, Take 1.5 tablets (15 mg total) by mouth every 6 (six) hours as needed., Disp: 150 tablet, Rfl: 0  Physical exam:  Vitals:   03/14/19 1007  BP: (!) 155/99  Pulse: 67  Temp: (!) 97.1 F (36.2 C)  TempSrc: Tympanic  Weight: 159 lb 8 oz (72.3 kg)   Physical Exam HENT:     Head: Normocephalic and atraumatic.  Eyes:     Pupils: Pupils are equal, round, and reactive to light.  Neck:     Comments: No palpable adenopathy Cardiovascular:     Rate and Rhythm: Normal rate and  regular rhythm.     Heart sounds: Normal heart sounds.  Pulmonary:     Effort: Pulmonary effort is normal.     Breath sounds: Normal breath sounds.  Abdominal:     General: Bowel sounds are normal.     Palpations: Abdomen is soft.  Musculoskeletal:     Cervical back: Normal range of motion.  Skin:    General: Skin is warm and dry.  Neurological:     Mental Status: He is alert and oriented to person, place, and time.      CMP Latest Ref Rng & Units 02/21/2019  Glucose 70 - 99 mg/dL 88  BUN 6 - 20 mg/dL 13  Creatinine 0.61 - 1.24 mg/dL 1.20  Sodium 135 - 145 mmol/L 140  Potassium 3.5 - 5.1 mmol/L 4.0  Chloride 98 - 111  mmol/L 106  CO2 22 - 32 mmol/L 27  Calcium 8.9 - 10.3 mg/dL 9.1  Total Protein 6.0 - 8.5 g/dL -  Total Bilirubin 0.0 - 1.2 mg/dL -  Alkaline Phos 39 - 117 IU/L -  AST 0 - 40 IU/L -  ALT 0 - 44 IU/L -   CBC Latest Ref Rng & Units 09/11/2015  WBC 3.4 - 10.8 x10E3/uL 3.2(L)  Hemoglobin 12.6 - 17.7 g/dL 14.4  Hematocrit 37.5 - 51.0 % 42.8  Platelets 150 - 399 K/L -    No images are attached to the encounter.  CT Soft Tissue Neck W Contrast  Result Date: 02/21/2019 CLINICAL DATA:  Initial evaluation for acute sore throat for 1 month. EXAM: CT NECK WITH CONTRAST TECHNIQUE: Multidetector CT imaging of the neck was performed using the standard protocol following the bolus administration of intravenous contrast. CONTRAST:  64mL OMNIPAQUE IOHEXOL 300 MG/ML  SOLN COMPARISON:  None available. FINDINGS: Pharynx and larynx: Small volume retained secretions seen within the oral cavity. Oral cavity otherwise within normal limits without abnormality. No visible tongue base mass. Palatine tonsils symmetric and within normal limits. Parapharyngeal fat maintained. Nasopharynx within normal limits. No retropharyngeal collection. Epiglottis within normal limits. There is asymmetric polypoid soft tissue positioned at the base of the left aryepiglottic fold measuring approximately 2.9  x 1.3 cm (series 2, image 64). Partial involvement of the adjacent left piriform sinus. Pre epiglottic fat is preserved. Small volume layering secretions noted within the adjacent hypopharynx. Glottis is opposed inferiorly and not well assessed. Subglottic airway clear. Salivary glands: Salivary glands including the parotid glands and submandibular glands are within normal limits. Thyroid: Thyroid within normal limits. Lymph nodes: No pathologically enlarged lymph nodes seen within the neck. Vascular: Normal intravascular enhancement seen throughout the neck. Moderate atherosclerotic change about the aortic arch, carotid bifurcations, and carotid siphons. Limited intracranial: Unremarkable. Visualized orbits: Visualized globes and orbital soft tissues within normal limits. Mastoids and visualized paranasal sinuses: Mild mucosal thickening noted within the maxillary sinuses. Visualized paranasal sinuses are otherwise clear. Visualize mastoids and middle ear cavities are well pneumatized and free of fluid. Skeleton: No acute osseous abnormality. No discrete or worrisome osseous lesions. Moderate cervical spondylosis, most notable at C5-6 and C6-7. Upper chest: Visualized upper chest demonstrates no acute finding. Centrilobular and paraseptal emphysematous changes noted within the visualized lungs. Other: None. IMPRESSION: 1. Apparent 2.9 x 1.3 cm polypoid soft tissue mass positioned at the base of the left aryepiglottic fold, indeterminate, but could reflect a primary head and neck malignancy. ENT referral for further evaluation and direct visualization recommended. 2. No other acute abnormality within the neck.  No adenopathy. 3. Aortic Atherosclerosis (ICD10-I70.0) and Emphysema (ICD10-J43.9). Electronically Signed   By: Jeannine Boga M.D.   On: 02/21/2019 03:15     Assessment and plan- Patient is a 59 y.o. 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  If based on PET CT scan patient has T2N0 disease-he will need radiation alone.  However if he has T3 or N1 disease I will add concurrent weekly cisplatin to radiation regimen.  Treatment will be given with a curative intent.  Follow-up to be decided based on PET CT scan.  I will refer the patient to nutrition given his swallowing difficulty and potential for weight loss during treatment  Neoplasm  related pain: I will DC his Percocet and switch him to oxycodone 15 mg every 6 hours as needed for pain   Visit Diagnosis 1. 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 Goals of care, counseling/discussion   3. Neoplasm related pain      Dr. Randa Evens, MD, MPH [facility] [facility] at [facility] [id number] 03/16/2019 8:12 AM

## 2019-03-21 ENCOUNTER — Other Ambulatory Visit: Payer: Self-pay | Admitting: *Deleted

## 2019-03-21 ENCOUNTER — Encounter
Admission: RE | Admit: 2019-03-21 | Discharge: 2019-03-21 | Disposition: A | Discharge status: Home / Self Care | Payer: BC Managed Care – PPO | Source: Ambulatory Visit | Attending: Oncology | Admitting: Oncology

## 2019-03-21 ENCOUNTER — Other Ambulatory Visit: Payer: Self-pay

## 2019-03-21 ENCOUNTER — Telehealth: Payer: Self-pay | Admitting: *Deleted

## 2019-03-21 DIAGNOSIS — R221 Localized swelling, mass and lump, neck: Secondary | ICD-10-CM | POA: Insufficient documentation

## 2019-03-21 LAB — GLUCOSE, CAPILLARY: Glucose-Capillary: 95 mg/dL (ref 70–99)

## 2019-03-21 MED ORDER — FLUDEOXYGLUCOSE F - 18 (FDG) INJECTION
8.2000 | Freq: Once | INTRAVENOUS | Status: AC | PRN
  Administered 2019-03-21: 10:00:00 8.75 via INTRAVENOUS

## 2019-03-21 MED ORDER — MAGIC MOUTHWASH W/LIDOCAINE: 5.0000 mL | Freq: Four times a day (QID) | ORAL | 2 refills | Status: DC

## 2019-03-21 NOTE — Telephone Encounter (Signed)
Pt walked in and said he does not feel that oxycodone is working and feels like it makes it worse. ENT gave him percocet and it did not work. Pt. Says he wants something else to help him. After speaking to dr Janese Banks then she says give MMW with lidocaine. Then inc. The pain med to 3 tablets every 6 hours. Take the MMW 45 min before taking pain med. rx sent into [facility]

## 2019-03-22 ENCOUNTER — Other Ambulatory Visit: Payer: Self-pay

## 2019-03-23 ENCOUNTER — Ambulatory Visit
Admission: RE | Admit: 2019-03-23 | Discharge: 2019-03-23 | Disposition: A | Discharge status: Home / Self Care | Payer: BC Managed Care – PPO | Source: Ambulatory Visit | Attending: Radiation Oncology | Admitting: Radiation Oncology

## 2019-03-23 ENCOUNTER — Other Ambulatory Visit: Payer: Self-pay

## 2019-03-23 DIAGNOSIS — Z510 Encounter for antineoplastic radiation therapy: Secondary | ICD-10-CM | POA: Insufficient documentation

## 2019-03-23 DIAGNOSIS — C139 Malignant neoplasm of hypopharynx, unspecified: Secondary | ICD-10-CM | POA: Insufficient documentation

## 2019-03-24 ENCOUNTER — Telehealth: Payer: Self-pay | Admitting: Oncology

## 2019-03-24 ENCOUNTER — Other Ambulatory Visit: Payer: Self-pay | Admitting: *Deleted

## 2019-03-24 DIAGNOSIS — C07 Malignant neoplasm of parotid gland: Secondary | ICD-10-CM

## 2019-03-24 MED ORDER — MAGIC MOUTHWASH W/LIDOCAINE: 5.0000 mL | Freq: Four times a day (QID) | ORAL | 2 refills | Status: DC

## 2019-03-24 NOTE — Telephone Encounter (Signed)
The medicine did not go through and I called and spoke to pharmacy and they took verbal order for MMW with lidocaine

## 2019-03-24 NOTE — Telephone Encounter (Signed)
Spoke with patient to schedule appt with MD. Patient stated that he did not want to see the nutritionist. Nutritionist appt has been cancelled. Appt with MD has been scheduled for 03-30-19. Message sent to radiation asking if they can move radiation appt that day to accommodate patient's appt with MD and work schedule.

## 2019-03-27 DIAGNOSIS — C139 Malignant neoplasm of hypopharynx, unspecified: Secondary | ICD-10-CM | POA: Diagnosis present

## 2019-03-27 DIAGNOSIS — Z510 Encounter for antineoplastic radiation therapy: Secondary | ICD-10-CM | POA: Diagnosis present

## 2019-03-30 ENCOUNTER — Encounter: Payer: Self-pay | Admitting: Oncology

## 2019-03-30 ENCOUNTER — Ambulatory Visit: Payer: BC Managed Care – PPO | Admitting: Oncology

## 2019-03-30 ENCOUNTER — Inpatient Hospital Stay: Payer: BC Managed Care – PPO | Attending: Oncology | Admitting: Oncology

## 2019-03-30 ENCOUNTER — Other Ambulatory Visit: Payer: Self-pay

## 2019-03-30 ENCOUNTER — Ambulatory Visit: Admission: RE | Admit: 2019-03-30 | Payer: BC Managed Care – PPO | Source: Ambulatory Visit

## 2019-03-30 VITALS — BP 153/86 | HR 66 | Temp 97.4°F | Resp 18 | Wt 154.4 lb

## 2019-03-30 DIAGNOSIS — T402X5A Adverse effect of other opioids, initial encounter: Secondary | ICD-10-CM | POA: Diagnosis not present

## 2019-03-30 DIAGNOSIS — Z8249 Family history of ischemic heart disease and other diseases of the circulatory system: Secondary | ICD-10-CM | POA: Diagnosis not present

## 2019-03-30 DIAGNOSIS — Z823 Family history of stroke: Secondary | ICD-10-CM | POA: Diagnosis not present

## 2019-03-30 DIAGNOSIS — Z833 Family history of diabetes mellitus: Secondary | ICD-10-CM | POA: Insufficient documentation

## 2019-03-30 DIAGNOSIS — R070 Pain in throat: Secondary | ICD-10-CM | POA: Insufficient documentation

## 2019-03-30 DIAGNOSIS — Z8049 Family history of malignant neoplasm of other genital organs: Secondary | ICD-10-CM | POA: Diagnosis not present

## 2019-03-30 DIAGNOSIS — G893 Neoplasm related pain (acute) (chronic): Secondary | ICD-10-CM | POA: Diagnosis not present

## 2019-03-30 DIAGNOSIS — K5903 Drug induced constipation: Secondary | ICD-10-CM | POA: Insufficient documentation

## 2019-03-30 DIAGNOSIS — C321 Malignant neoplasm of supraglottis: Secondary | ICD-10-CM

## 2019-03-30 DIAGNOSIS — Z7189 Other specified counseling: Secondary | ICD-10-CM | POA: Diagnosis not present

## 2019-03-30 DIAGNOSIS — I10 Essential (primary) hypertension: Secondary | ICD-10-CM | POA: Diagnosis not present

## 2019-03-30 DIAGNOSIS — F17210 Nicotine dependence, cigarettes, uncomplicated: Secondary | ICD-10-CM | POA: Diagnosis not present

## 2019-03-30 DIAGNOSIS — Z800 Family history of malignant neoplasm of digestive organs: Secondary | ICD-10-CM | POA: Diagnosis not present

## 2019-03-30 MED ORDER — MORPHINE SULFATE 30 MG PO TABS: 30.0000 mg | ORAL_TABLET | ORAL | 0 refills | Status: DC | PRN

## 2019-03-30 NOTE — Progress Notes (Signed)
Patient here for follow up. Complains of throat pain not relieved by mouthwash or pain medication.

## 2019-03-31 DIAGNOSIS — C139 Malignant neoplasm of hypopharynx, unspecified: Secondary | ICD-10-CM | POA: Diagnosis present

## 2019-03-31 DIAGNOSIS — Z510 Encounter for antineoplastic radiation therapy: Secondary | ICD-10-CM | POA: Diagnosis present

## 2019-03-31 NOTE — Progress Notes (Signed)
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 Preepiglottic space is preserved. No pathologically enlarged lymph nodes in the neck.Patient states that his symptoms have been ongoing for the last 3 to 4 months. He is able to swallow but reports occasional pain during swallowing. Denies any unintentional weight loss. He is a current smoker and smokes about 1 pack/day and has been doing so over the last 45 years. He also drinks occasional alcohol.  Patient placed seen by Dr. Daisy Blossom and underwent an ENT exam.  As per the clinical impression this was a T2 disease.  Biopsy showed squamous cell carcinoma  PET CT scan showed hyper intense hypermetabolic activity in the posterior left hypopharynx which localizes to left area epiglottic folds, left piriform sinus and pharyngeal mucosa.  It does not extend into the parapharyngeal space.  No hypermetabolic left or right cervical lymph nodes.  Less intense metabolic activity localized to soft palate and bilateral base of tongue which is symmetric and favored physiologic.  Interval history-patient was started on 15 mg oxycodone every 6 as needed for pain and the dose was  increased to 30 mg.  However patient states pain medicine is still not helping and he continues to have significant pain especially with swallowing.  He starts radiation treatment next week.  Also reports not having bowel movements for the last 4 to 5 days  ECOG PS- 1 Pain scale- 9 Opioid associated constipation- yes  Review of systems- Review of Systems  Constitutional: Negative for chills, fever, malaise/fatigue and weight loss.  HENT: Negative for congestion, ear discharge and nosebleeds.        Throat pain  Eyes: Negative for blurred vision.  Respiratory: Negative for cough, hemoptysis, sputum production, shortness of breath and wheezing.   Cardiovascular: Negative for chest pain, palpitations, orthopnea and claudication.  Gastrointestinal: Negative for abdominal pain, blood in stool, constipation, diarrhea, heartburn, melena, nausea and vomiting.  Genitourinary: Negative for dysuria, flank pain, frequency, hematuria and urgency.  Musculoskeletal: Negative for back pain, joint pain and myalgias.  Skin: Negative for rash.  Neurological: Negative for dizziness, tingling, focal weakness, seizures, weakness and headaches.  Endo/Heme/Allergies: Does not bruise/bleed easily.  Psychiatric/Behavioral: Negative for depression and suicidal ideas. The patient does not have insomnia.       Allergies  Allergen Reactions  . Pollen Extract Other (See Comments)    sneezing     Past Medical History:  Diagnosis Date  . Hypertension      Past Surgical History:  Procedure Laterality Date  . LARYNGOSCOPY Bilateral 03/02/2019   Procedure: DIRECT LARYNGOSCOPY W/ BX.;  Surgeon: Margaretha Sheffield, MD;  Location: [place];  Service: ENT;  Laterality: Bilateral;  . NO PAST SURGERIES    . RIGID ESOPHAGOSCOPY Bilateral 03/02/2019   Procedure:  RIGID ESOPHAGOSCOPY RIGID BRONCOSCOPY;  Surgeon: Margaretha Sheffield, MD;  Location: [place];  Service: ENT;  Laterality: Bilateral;    Social  History   Socioeconomic History  . Marital status: Single    Spouse name: Not on file  . Number of children: Not on file  . Years of education: Not on file  . Highest education level: Not on file  Occupational History  . Not on file  Tobacco Use  . Smoking status: Current Every Day Smoker    Packs/day: 1.00    Years: 45.00    Pack years: 45.00    Types: Cigarettes  . Smokeless tobacco: Current User  Substance and Sexual Activity  . Alcohol use: Yes    Alcohol/week: 7.0 standard drinks    Types: 7 Cans of beer per week  . Drug use: Not on file  . Sexual activity: Not on file  Other Topics Concern  . Not on file  Social History Narrative  . Not on file   Social Determinants of Health   Financial Resource Strain:   . Difficulty of Paying Living Expenses: Not on file  Food Insecurity:   . Worried About Charity fundraiser in the Last Year: Not on file  . Ran Out of Food in the Last Year: Not on file  Transportation Needs:   . Lack of Transportation (Medical): Not on file  . Lack of Transportation (Non-Medical): Not on file  Physical Activity:   . Days of Exercise per Week: Not on file  . Minutes of Exercise per Session: Not on file  Stress:   . Feeling of Stress : Not on file  Social Connections:   . Frequency of Communication with Friends and Family: Not on file  . Frequency of Social Gatherings with Friends and Family: Not on file  . Attends Religious Services: Not on file  . Active Member of Clubs or Organizations: Not on file  . Attends Archivist Meetings: Not on file  . Marital Status: Not on file  Intimate Partner Violence:   . Fear of Current or Ex-Partner: Not on file  . Emotionally Abused: Not on file  . Physically Abused: Not on file  . Sexually Abused: Not on file    Family History  Problem Relation Age of Onset  . Diabetes Mellitus I Mother   . Pancreatic cancer Mother   . Stroke Mother   . Heart Problems Sister   . Thyroid disease  Sister   . Hypertension Sister   . Cervical cancer Sister      Current Outpatient Medications:  .  magic mouthwash w/lidocaine SOLN, Take 5 mLs by mouth 4 (four) times daily., Disp: 320 mL, Rfl: 2 .  morphine (MSIR) 30 MG tablet, Take 1 tablet (30 mg total) by mouth every 4 (four) hours as needed for severe pain., Disp: 120 tablet, Rfl: 0  Physical exam:  Vitals:   03/30/19 1345  BP: (!) 153/86  Pulse: 66  Resp: 18  Temp: (!) 97.4 F (36.3 C)  Weight: 154 lb 6.4 oz (70 kg)   Physical Exam HENT:     Mouth/Throat:     Mouth: Mucous membranes are moist.     Pharynx: Oropharynx is clear.  Cardiovascular:     Rate and Rhythm: Normal rate and regular rhythm.     Heart sounds: Normal heart sounds.  Pulmonary:     Effort: Pulmonary effort is normal.     Breath sounds: Normal breath sounds.  Skin:    General: Skin is warm and dry.  Neurological:     Mental Status: He is alert and oriented to person, place, and time.      CMP Latest Ref Rng & Units 02/21/2019  Glucose 70 - 99 mg/dL 88  BUN 6 - 20 mg/dL 13  Creatinine 0.61 - 1.24 mg/dL 1.20  Sodium 135 - 145 mmol/L 140  Potassium 3.5 - 5.1 mmol/L 4.0  Chloride 98 - 111 mmol/L 106  CO2 22 - 32 mmol/L 27  Calcium 8.9 - 10.3 mg/dL 9.1  Total Protein 6.0 - 8.5 g/dL -  Total Bilirubin 0.0 - 1.2 mg/dL -  Alkaline Phos 39 - 117 IU/L -  AST 0 - 40 IU/L -  ALT 0 - 44 IU/L -   CBC Latest Ref Rng & Units 09/11/2015  WBC 3.4 - 10.8 x10E3/uL 3.2(L)  Hemoglobin 12.6 - 17.7 g/dL 14.4  Hematocrit 37.5 - 51.0 % 42.8  Platelets 150 - 399 K/L -    No images are attached to the encounter.  NM PET Image Initial (PI) Skull Base To Thigh  Result Date: 03/21/2019 CLINICAL DATA:  Initial treatment strategy for head neck carcinoma. EXAM: NUCLEAR MEDICINE PET SKULL BASE TO THIGH TECHNIQUE: 8.75 mCi F-18 FDG was injected intravenously. Full-ring PET imaging was performed from the skull base to thigh after the radiotracer. CT data was obtained  and used for attenuation correction and anatomic localization. Fasting blood glucose: 95 mg/dl COMPARISON:  None. FINDINGS: Mediastinal blood pool activity: SUV max Liver activity: SUV max NA NECK: Intense hypermetabolic activity in the posterior LEFT hypopharynx. Activity localizes to LEFT area epiglottic folds, left piriform sinus, and pharyngeal mucosa of mucosa of the LEFT posterior hypopharynx. The posterior hypopharynx activity approaches midline. Activity is intense with SUV max equal 16 point. Activity is localized to the pharyngeal mucosa. Activity does not extend into the parapharyngeal space. No hypermetabolic activity in the LEFT or RIGHT cervical lymph nodes. No enlarged cervical lymph nodes. Less intense intense metabolic activity localized to the soft palate and bilateral base of tongue which is symmetric and favored physiologic Incidental CT findings: none CHEST: No hypermetabolic mediastinal or hilar nodes. No suspicious pulmonary nodules on the CT scan. Incidental CT findings: none ABDOMEN/PELVIS: No abnormal hypermetabolic activity within the liver, pancreas, adrenal glands, or spleen. No hypermetabolic lymph nodes in the abdomen or pelvis. Incidental CT findings: Atherosclerotic calcification of the aorta. SKELETON: No focal hypermetabolic activity to suggest skeletal metastasis. Incidental CT findings: none IMPRESSION: 1. Intense hypermetabolic activity localizing to the LEFT posterolateral hypopharynx consistent with primary head neck carcinoma. 2. No evidence extension beyond the pharyngeal mucosa. 3. No hypermetabolic cervical lymph nodes. 4. Hypermetabolic activity associated with the soft palate and tongue base is symmetric in favored physiologic. 5. No evidence distant metastatic disease. Electronically Signed   By: Suzy Bouchard M.D.   On: 03/21/2019 15:54     Assessment and plan- Patient is a 59 y.o. 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  Based on PET CT scan, she does not have any hypermetabolic cervical adenopathy and no involvement of parapharyngeal space.  He therefore has T2 disease and per NCCN guideline he will need radiation alone without chemotherapy.  Patient will be starting radiation treatment next week.  Treatment will be given with a  curative intent.  I will see him periodically during radiation to see if he needs any IV fluids  Neoplasm related pain: Patient reports no pain relief at all with increased dose of oxycodone 30 mg every 4 as needed.  Discussed morphine and Dilaudid as alternatives.  Patient would like to try morphine and I will go ahead and give him morphine IR 30 mg every 4 hours as needed.  Explained to the patient that he will need to be patient while radiation starts working to help with ongoing pain.  I would like NP Altha Harm to assess him next week to evaluate his ongoing pain and see if long-acting pain medications are needed.  I would prefer to make slow changes to his pain medicines especially in the light of radiation treatment start.  Opioid-induced constipation: Encourage patient to take daily MiraLAX along with senna to assist with bowel movements.  I will see him in 3 weeks to assess his ongoing pain   Visit Diagnosis 1. 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 Goals of care, counseling/discussion   3. Neoplasm related pain      Dr. Randa Evens, MD, MPH [facility] at [facility] [id number] 03/31/2019 8:23 AM

## 2019-04-03 ENCOUNTER — Other Ambulatory Visit: Payer: Self-pay

## 2019-04-03 ENCOUNTER — Ambulatory Visit
Admission: RE | Admit: 2019-04-03 | Discharge: 2019-04-03 | Disposition: A | Discharge status: Home / Self Care | Payer: BC Managed Care – PPO | Source: Ambulatory Visit | Attending: Radiation Oncology | Admitting: Radiation Oncology

## 2019-04-03 DIAGNOSIS — C139 Malignant neoplasm of hypopharynx, unspecified: Secondary | ICD-10-CM | POA: Diagnosis not present

## 2019-04-04 ENCOUNTER — Inpatient Hospital Stay: Payer: BC Managed Care – PPO

## 2019-04-04 ENCOUNTER — Other Ambulatory Visit: Payer: Self-pay | Admitting: *Deleted

## 2019-04-04 ENCOUNTER — Inpatient Hospital Stay (HOSPITAL_BASED_OUTPATIENT_CLINIC_OR_DEPARTMENT_OTHER): Payer: BC Managed Care – PPO | Admitting: Hospice and Palliative Medicine

## 2019-04-04 ENCOUNTER — Other Ambulatory Visit: Payer: Self-pay

## 2019-04-04 ENCOUNTER — Ambulatory Visit
Admission: RE | Admit: 2019-04-04 | Discharge: 2019-04-04 | Disposition: A | Discharge status: Home / Self Care | Payer: BC Managed Care – PPO | Source: Ambulatory Visit | Attending: Radiation Oncology | Admitting: Radiation Oncology

## 2019-04-04 VITALS — BP 150/80 | HR 66 | Temp 98.0°F | Resp 18 | Wt 153.3 lb

## 2019-04-04 DIAGNOSIS — Z515 Encounter for palliative care: Secondary | ICD-10-CM

## 2019-04-04 DIAGNOSIS — C321 Malignant neoplasm of supraglottis: Secondary | ICD-10-CM

## 2019-04-04 DIAGNOSIS — C139 Malignant neoplasm of hypopharynx, unspecified: Secondary | ICD-10-CM | POA: Diagnosis not present

## 2019-04-04 LAB — BASIC METABOLIC PANEL
Anion gap: 11 (ref 5–15)
BUN: 10 mg/dL (ref 6–20)
CO2: 25 mmol/L (ref 22–32)
Calcium: 9.3 mg/dL (ref 8.9–10.3)
Chloride: 102 mmol/L (ref 98–111)
Creatinine, Ser: 1.26 mg/dL — ABNORMAL HIGH (ref 0.61–1.24)
GFR calc Af Amer: >60 mL/min (ref >60)
GFR calc non Af Amer: >60 mL/min (ref >60)
Glucose, Bld: 111 mg/dL — ABNORMAL HIGH (ref 70–99)
Potassium: 3.7 mmol/L (ref 3.5–5.1)
Sodium: 138 mmol/L (ref 135–145)

## 2019-04-04 MED ORDER — SUCRALFATE 1 G PO TABS: 1.0000 g | ORAL_TABLET | Freq: Three times a day (TID) | ORAL | 6 refills | Status: DC

## 2019-04-04 MED ORDER — MORPHINE SULFATE 30 MG PO TABS: 30.0000 mg | ORAL_TABLET | ORAL | 0 refills | Status: DC | PRN

## 2019-04-04 MED ORDER — NALOXONE HCL 4 MG/0.1ML NA LIQD: NASAL | 0 refills | Status: DC

## 2019-04-04 NOTE — Telephone Encounter (Signed)
Pt could not afford the pain med. Barnabas Lister will pay for it on [employer] and I need to send it to [facility]. Barnabas Lister to call pt and let him know.

## 2019-04-04 NOTE — Progress Notes (Addendum)
[facility]  Telephone:([phone] [phone]   Name: Tyler Holloway Date: 04/04/2019 MRN: [id number]  DOB: 1960/09/13  Patient Care Team: Patient, No Pcp Per as PCP - General (General Practice)    REASON FOR CONSULTATION: Tyler Holloway is a 59 y.o. 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  PET scan showed hypermetabolic activity in the left hypopharynx.  Patient was referred for XRT with curative intent.  He has had significant odynophagia.  He was referred to palliative care to help address goals and manage ongoing symptoms.  SOCIAL HISTORY:     reports that he has been smoking cigarettes. He has a 45.00 pack-year smoking history. He uses smokeless tobacco. He reports current alcohol use of about 7.0 standard drinks of alcohol per week.   Patient is unmarried.  He lives at home with a niece and nephew.  He has a daughter in [place].  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  ADVANCE DIRECTIVES:  Does not have  CODE STATUS:   PAST MEDICAL HISTORY: Past Medical History:  Diagnosis Date   Hypertension     PAST SURGICAL HISTORY:  Past Surgical History:  Procedure Laterality Date   LARYNGOSCOPY Bilateral 03/02/2019   Procedure: DIRECT LARYNGOSCOPY W/ BX.;  Surgeon: Margaretha Sheffield, MD;  Location: [place];  Service: ENT;  Laterality: Bilateral;   NO PAST SURGERIES     RIGID ESOPHAGOSCOPY Bilateral 03/02/2019   Procedure: RIGID ESOPHAGOSCOPY RIGID BRONCOSCOPY;  Surgeon: Margaretha Sheffield, MD;  Location: [place];  Service: ENT;  Laterality: Bilateral;    HEMATOLOGY/ONCOLOGY HISTORY:  Oncology History   No history exists.    ALLERGIES:  is allergic to pollen extract.  MEDICATIONS:  Current Outpatient Medications  Medication Sig Dispense Refill   magic mouthwash w/lidocaine SOLN Take 5 mLs by mouth 4 (four) times  daily. 320 mL 2   morphine (MSIR) 30 MG tablet Take 1 tablet (30 mg total) by mouth every 4 (four) hours as needed for severe pain. 120 tablet 0   No current facility-administered medications for this visit.    VITAL SIGNS: There were no vitals taken for this visit. There were no vitals filed for this visit.  Estimated body mass index is 19.04 kg/m as calculated from the following:   Height as of 03/02/19: 6' 3.51" (1.918 m).   Weight as of 03/30/19: 154 lb 6.4 oz (70 kg).  LABS: CBC:    Component Value Date/Time   WBC 3.2 (L) 09/11/2015 1020   HGB 14.4 09/11/2015 1020   HCT 42.8 09/11/2015 1020   PLT 121 (A) 12/25/2014 0000   MCV 97 09/11/2015 1020   NEUTROABS 1.2 (L) 09/11/2015 1020   LYMPHSABS 1.4 09/11/2015 1020   EOSABS 0.1 09/11/2015 1020   BASOSABS 0.0 09/11/2015 1020   Comprehensive Metabolic Panel:    Component Value Date/Time   NA 140 02/21/2019 0204   NA 139 09/11/2015 1020   K 4.0 02/21/2019 0204   CL 106 02/21/2019 0204   CO2 27 02/21/2019 0204   BUN 13 02/21/2019 0204   BUN 14 09/11/2015 1020   CREATININE 1.20 02/21/2019 0204   GLUCOSE 88 02/21/2019 0204   CALCIUM 9.1 02/21/2019 0204   AST 203 (H) 09/11/2015 1020   ALT 110 (H) 09/11/2015 1020   ALKPHOS 123 (H) 09/11/2015 1020   BILITOT 1.4 (H) 09/11/2015 1020   PROT 7.5 09/11/2015 1020  ALBUMIN 4.0 09/11/2015 1020    RADIOGRAPHIC STUDIES: NM PET Image Initial (PI) Skull Base To Thigh  Result Date: 03/21/2019 CLINICAL DATA:  Initial treatment strategy for head neck carcinoma. EXAM: NUCLEAR MEDICINE PET SKULL BASE TO THIGH TECHNIQUE: 8.75 mCi F-18 FDG was injected intravenously. Full-ring PET imaging was performed from the skull base to thigh after the radiotracer. CT data was obtained and used for attenuation correction and anatomic localization. Fasting blood glucose: 95 mg/dl COMPARISON:  None. FINDINGS: Mediastinal blood pool activity: SUV max Liver activity: SUV max NA NECK: Intense hypermetabolic  activity in the posterior LEFT hypopharynx. Activity localizes to LEFT area epiglottic folds, left piriform sinus, and pharyngeal mucosa of mucosa of the LEFT posterior hypopharynx. The posterior hypopharynx activity approaches midline. Activity is intense with SUV max equal 16 point. Activity is localized to the pharyngeal mucosa. Activity does not extend into the parapharyngeal space. No hypermetabolic activity in the LEFT or RIGHT cervical lymph nodes. No enlarged cervical lymph nodes. Less intense intense metabolic activity localized to the soft palate and bilateral base of tongue which is symmetric and favored physiologic Incidental CT findings: none CHEST: No hypermetabolic mediastinal or hilar nodes. No suspicious pulmonary nodules on the CT scan. Incidental CT findings: none ABDOMEN/PELVIS: No abnormal hypermetabolic activity within the liver, pancreas, adrenal glands, or spleen. No hypermetabolic lymph nodes in the abdomen or pelvis. Incidental CT findings: Atherosclerotic calcification of the aorta. SKELETON: No focal hypermetabolic activity to suggest skeletal metastasis. Incidental CT findings: none IMPRESSION: 1. Intense hypermetabolic activity localizing to the LEFT posterolateral hypopharynx consistent with primary head neck carcinoma. 2. No evidence extension beyond the pharyngeal mucosa. 3. No hypermetabolic cervical lymph nodes. 4. Hypermetabolic activity associated with the soft palate and tongue base is symmetric in favored physiologic. 5. No evidence distant metastatic disease. Electronically Signed   By: Suzy Bouchard M.D.   On: 03/21/2019 15:54    PERFORMANCE STATUS (ECOG) : 1 - Symptomatic but completely ambulatory  Review of Systems Unless otherwise noted, a complete review of systems is negative.  Physical Exam General: NAD Pulmonary: Unlabored Extremities: no edema, no joint deformities Skin: no rashes Neurological: Weakness but otherwise nonfocal  IMPRESSION: I met with  patient today in the clinic.  Introduced palliative care services and attempted establish therapeutic rapport.  Patient endorses persistent odynophagia.  However, the consistency of his food/fluids seem to affect the pain.  He is currently tolerating drinking thin liquids with ice without difficulty but says that foods consistently cause pain.  Patient was prescribed oxycodone but did not find it helpful despite multiple dose increases.  He was [facility] but says that he has not yet had that filled from the pharmacy due to cost.  Patient also endorses constipation, likely secondary to opioid use.  However, he admits to noncompliance with the bowel regimen suggested by Dr. Janese Banks.  He says it is not currently taking anything for constipation.  He is generally having 1 bowel movement a week.  I strongly suggested that he start daily senna with/without MiraLAX.  Patient reports poor oral intake with weight loss.  It appears that his weight is down 15 pounds over the past 2 months.  We discussed adding oral supplements but patient seemed resistant to the idea.  I suggested smaller more frequent meals with a focus on high-protein and high-calorie foods.  I recommended that he speak with a dietitian but he did not seem committed to the idea.  Note the patient admits to a  history of polysubstance abuse.  He says that he was drinking heavily (at least 40 ounces of alcohol daily) up until a few weeks ago.  He also has a history of crack cocaine with most recent use a few months ago.  Patient is at high risk for opioid prescribing given his history of polysubstance use and admitted noncompliance.  However, patient is at high risk for pain associated with his cancer.  Hopefully, radiation, which began this week, will help ameliorate his pain and lessen his needs for opioids going forward.  There would not be a plan for long-term prescribing of opioids from this clinic following completion of radiation.   Should pain become chronic, referral to pain management would be appropriate.  We discussed the safe use and storage of his pain medications, particularly in the home with children.  I also discouraged driving or operating machinery while taking pain medications.  Will prescribe naloxone.  We discussed establishing ACP documents but patient said that he was not interested.  He did say that he would want his sister to be involved in decision-making if necessary.  PLAN: -Continue current scope of treatment -MSIR 30 mg every 4 hours as needed for breakthrough pain (#120) -Recommend daily bowel regimen with Senokot/MiraLAX -Referral to ST for dysphagia -Referral to RD for nutritional support -Referral to SW for financial resources -Naloxone kit -Virtual visit in 2-3 weeks.   Case and plan discussed with Dr. Janese Banks   Patient expressed understanding and was in agreement with this plan. He also understands that He can call the clinic at any time with any questions, concerns, or complaints.     Time Total: 20 minutes  Visit consisted of counseling and education dealing with the complex and emotionally intense issues of symptom management and palliative care in the setting of serious and potentially life-threatening illness.Greater than 50%  of this time was spent counseling and coordinating care related to the above assessment and plan.  Signed by: Altha Harm, PhD, NP-C

## 2019-04-05 ENCOUNTER — Ambulatory Visit
Admission: RE | Admit: 2019-04-05 | Discharge: 2019-04-05 | Disposition: A | Discharge status: Home / Self Care | Payer: BC Managed Care – PPO | Source: Ambulatory Visit | Attending: Radiation Oncology | Admitting: Radiation Oncology

## 2019-04-05 DIAGNOSIS — C139 Malignant neoplasm of hypopharynx, unspecified: Secondary | ICD-10-CM | POA: Diagnosis not present

## 2019-04-06 ENCOUNTER — Ambulatory Visit
Admission: RE | Admit: 2019-04-06 | Discharge: 2019-04-06 | Disposition: A | Discharge status: Home / Self Care | Payer: BC Managed Care – PPO | Source: Ambulatory Visit | Attending: Radiation Oncology | Admitting: Radiation Oncology

## 2019-04-06 DIAGNOSIS — C139 Malignant neoplasm of hypopharynx, unspecified: Secondary | ICD-10-CM | POA: Diagnosis not present

## 2019-04-07 ENCOUNTER — Ambulatory Visit
Admission: RE | Admit: 2019-04-07 | Discharge: 2019-04-07 | Disposition: A | Discharge status: Home / Self Care | Payer: BC Managed Care – PPO | Source: Ambulatory Visit | Attending: Radiation Oncology | Admitting: Radiation Oncology

## 2019-04-07 DIAGNOSIS — C139 Malignant neoplasm of hypopharynx, unspecified: Secondary | ICD-10-CM | POA: Diagnosis not present

## 2019-04-10 ENCOUNTER — Ambulatory Visit
Admission: RE | Admit: 2019-04-10 | Discharge: 2019-04-10 | Disposition: A | Discharge status: Home / Self Care | Payer: BC Managed Care – PPO | Source: Ambulatory Visit | Attending: Radiation Oncology | Admitting: Radiation Oncology

## 2019-04-10 ENCOUNTER — Telehealth: Payer: Self-pay

## 2019-04-10 DIAGNOSIS — C139 Malignant neoplasm of hypopharynx, unspecified: Secondary | ICD-10-CM | POA: Diagnosis not present

## 2019-04-10 NOTE — Telephone Encounter (Signed)
Nutrition Assessment   Reason for Assessment: Referral from [place], NP. NP explained importance of nutrition despite patient cancelling earlier  nutrition appointment  ASSESSMENT:  59 year old male with squamous cell carcinoma of larynx.  Past medical history of polysubstance abuse, HTN.  Patient receiving radiation alone.    Spoke with patient via phone.  Patient reports that he has not eaten anything today (1:15pm) so far.  Has been up since 10am and came to radiation treatment at 11:15.  Reports did not eat anything yesterday. Reports that he has access to food and does not need assistance getting food.  Reports pain when swallowing but confirms that he can eat all types of foods.  "I know what I need to do."  Patient hard to hear on phone.  Does not drink oral nutrition supplements .  Medications: magic mouthwash   Labs: reviewed   Anthropometrics:   Height: 75 inches Weight: 153 lb 4 oz on 3/9 163 lb on 1/26 BMI: 18  6% weight loss in the last month 1/2, significant  Estimated Energy Needs  Kcals: 2100-2450 Protein: 105-122 g Fluid: 18   NUTRITION DIAGNOSIS: Inadequate oral intake related to cancer related treatment side effects as evidenced by 6% weight loss in the last month 1/2.     INTERVENTION:  Patient confirms that he has access to food. Denies needing help with food or obtaining food. Declines offer for free oral nutrition supplements Explained importance of nutrition during treatments. Encouraged patient to eat within 1-2 hours of getting up.   Discussed importance of getting good calories and protein.  Discussed changing consistencies of foods to make it less painful to swallow.   Patient agreeable to RD calling him periodically to check on him.   Patient reports that he has RD's contact number in phone.    MONITORING, EVALUATION, GOAL: Patient will consume adequate calories and protein to prevent weight loss   Next Visit: phone f/u April 1  (Thursday)  Kirk Sampley B. Zenia Resides, [place], [place] Registered Dietitian [phone] (pager)

## 2019-04-11 ENCOUNTER — Ambulatory Visit
Admission: RE | Admit: 2019-04-11 | Discharge: 2019-04-11 | Disposition: A | Discharge status: Home / Self Care | Payer: BC Managed Care – PPO | Source: Ambulatory Visit | Attending: Radiation Oncology | Admitting: Radiation Oncology

## 2019-04-11 ENCOUNTER — Other Ambulatory Visit: Payer: Self-pay | Admitting: *Deleted

## 2019-04-11 DIAGNOSIS — C139 Malignant neoplasm of hypopharynx, unspecified: Secondary | ICD-10-CM | POA: Diagnosis not present

## 2019-04-12 ENCOUNTER — Ambulatory Visit
Admission: RE | Admit: 2019-04-12 | Discharge: 2019-04-12 | Disposition: A | Discharge status: Home / Self Care | Payer: BC Managed Care – PPO | Source: Ambulatory Visit | Attending: Radiation Oncology | Admitting: Radiation Oncology

## 2019-04-12 DIAGNOSIS — C139 Malignant neoplasm of hypopharynx, unspecified: Secondary | ICD-10-CM | POA: Diagnosis not present

## 2019-04-13 ENCOUNTER — Ambulatory Visit
Admission: RE | Admit: 2019-04-13 | Discharge: 2019-04-13 | Disposition: A | Discharge status: Home / Self Care | Payer: BC Managed Care – PPO | Source: Ambulatory Visit | Attending: Radiation Oncology | Admitting: Radiation Oncology

## 2019-04-13 ENCOUNTER — Inpatient Hospital Stay: Payer: BC Managed Care – PPO

## 2019-04-13 DIAGNOSIS — C07 Malignant neoplasm of parotid gland: Secondary | ICD-10-CM

## 2019-04-13 DIAGNOSIS — C139 Malignant neoplasm of hypopharynx, unspecified: Secondary | ICD-10-CM | POA: Diagnosis not present

## 2019-04-13 DIAGNOSIS — C321 Malignant neoplasm of supraglottis: Secondary | ICD-10-CM | POA: Diagnosis not present

## 2019-04-13 LAB — CBC
HCT: 44.2 % (ref 39.0–52.0)
Hemoglobin: 14.5 g/dL (ref 13.0–17.0)
MCH: 31.6 pg (ref 26.0–34.0)
MCHC: 32.8 g/dL (ref 30.0–36.0)
MCV: 96.3 fL (ref 80.0–100.0)
Platelets: 233 10*3/uL (ref 150–400)
RBC: 4.59 MIL/uL (ref 4.22–5.81)
RDW: 11.8 % (ref 11.5–15.5)
WBC: 6 10*3/uL (ref 4.0–10.5)
nRBC: 0 % (ref 0.0–0.2)

## 2019-04-14 ENCOUNTER — Ambulatory Visit
Admission: RE | Admit: 2019-04-14 | Discharge: 2019-04-14 | Disposition: A | Discharge status: Home / Self Care | Payer: BC Managed Care – PPO | Source: Ambulatory Visit | Attending: Radiation Oncology | Admitting: Radiation Oncology

## 2019-04-14 DIAGNOSIS — C139 Malignant neoplasm of hypopharynx, unspecified: Secondary | ICD-10-CM | POA: Diagnosis not present

## 2019-04-17 ENCOUNTER — Ambulatory Visit: Payer: BC Managed Care – PPO

## 2019-04-17 ENCOUNTER — Ambulatory Visit
Admission: RE | Admit: 2019-04-17 | Discharge: 2019-04-17 | Disposition: A | Discharge status: Home / Self Care | Payer: BC Managed Care – PPO | Source: Ambulatory Visit | Attending: Radiation Oncology | Admitting: Radiation Oncology

## 2019-04-17 DIAGNOSIS — C139 Malignant neoplasm of hypopharynx, unspecified: Secondary | ICD-10-CM | POA: Diagnosis not present

## 2019-04-18 ENCOUNTER — Ambulatory Visit: Payer: BC Managed Care – PPO

## 2019-04-18 ENCOUNTER — Ambulatory Visit
Admission: RE | Admit: 2019-04-18 | Discharge: 2019-04-18 | Disposition: A | Discharge status: Home / Self Care | Payer: BC Managed Care – PPO | Source: Ambulatory Visit | Attending: Radiation Oncology | Admitting: Radiation Oncology

## 2019-04-18 ENCOUNTER — Inpatient Hospital Stay: Payer: BC Managed Care – PPO | Admitting: Oncology

## 2019-04-18 DIAGNOSIS — C139 Malignant neoplasm of hypopharynx, unspecified: Secondary | ICD-10-CM | POA: Diagnosis not present

## 2019-04-19 ENCOUNTER — Other Ambulatory Visit: Payer: Self-pay

## 2019-04-19 ENCOUNTER — Inpatient Hospital Stay (HOSPITAL_BASED_OUTPATIENT_CLINIC_OR_DEPARTMENT_OTHER): Payer: BC Managed Care – PPO | Admitting: Hospice and Palliative Medicine

## 2019-04-19 ENCOUNTER — Ambulatory Visit
Admission: RE | Admit: 2019-04-19 | Discharge: 2019-04-19 | Disposition: A | Discharge status: Home / Self Care | Payer: BC Managed Care – PPO | Source: Ambulatory Visit | Attending: Radiation Oncology | Admitting: Radiation Oncology

## 2019-04-19 DIAGNOSIS — C321 Malignant neoplasm of supraglottis: Secondary | ICD-10-CM | POA: Diagnosis not present

## 2019-04-19 DIAGNOSIS — Z515 Encounter for palliative care: Secondary | ICD-10-CM | POA: Diagnosis not present

## 2019-04-19 DIAGNOSIS — G893 Neoplasm related pain (acute) (chronic): Secondary | ICD-10-CM | POA: Diagnosis not present

## 2019-04-19 DIAGNOSIS — C139 Malignant neoplasm of hypopharynx, unspecified: Secondary | ICD-10-CM | POA: Diagnosis not present

## 2019-04-19 NOTE — Progress Notes (Signed)
Virtual Visit via Telephone Note  I connected with Tyler Holloway on 04/19/19 at  1:00 PM EDT by telephone and verified that I am speaking with the correct person using two identifiers.   I discussed the limitations, risks, security and privacy concerns of performing an evaluation and management service by telephone and the availability of in person appointments. I also discussed with the patient that there may be a patient responsible charge related to this service. The patient expressed understanding and agreed to proceed.   History of Present Illness: Tyler Holloway is a 59 y.o. 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  PET scan showed hypermetabolic activity in the left hypopharynx.  Patient was referred for XRT with curative intent.  He has had significant odynophagia.  He was referred to palliative care to help address goals and manage ongoing symptoms.   Observations/Objective: I called and spoke with patient today.  He reports that he is doing "better."  He did not find that the pain medications were particularly helpful so he says that he stopped taking them.  He has found that pain has been improving through radiation.  He says that he swallowing better and that his appetite has also improved.  Patient is taking as needed laxatives, which have resolved his constipation.  He denies any other changes or concerns today.  Assessment and Plan: Stage II squamous cell of the larynx -receiving XRT  Odynophagia-improved with XRT.  Continue to monitor  Constipation-improved with as needed laxatives  Nutrition-being followed by RD.  Patient reports improved appetite.  Follow weight trends  Follow Up Instructions: Virtual visit in 2 months   I discussed the assessment and treatment plan with the patient. The patient was provided an opportunity to ask questions and all were answered. The patient agreed with  the plan and demonstrated an understanding of the instructions.   The patient was advised to call back or seek an in-person evaluation if the symptoms worsen or if the condition fails to improve as anticipated.  I provided 6 minutes of non-face-to-face time during this encounter.   Irean Hong, NP

## 2019-04-20 ENCOUNTER — Encounter: Payer: BC Managed Care – PPO | Admitting: Hospice and Palliative Medicine

## 2019-04-20 ENCOUNTER — Ambulatory Visit
Admission: RE | Admit: 2019-04-20 | Discharge: 2019-04-20 | Disposition: A | Discharge status: Home / Self Care | Payer: BC Managed Care – PPO | Source: Ambulatory Visit | Attending: Radiation Oncology | Admitting: Radiation Oncology

## 2019-04-20 ENCOUNTER — Inpatient Hospital Stay: Payer: BC Managed Care – PPO

## 2019-04-20 ENCOUNTER — Inpatient Hospital Stay (HOSPITAL_BASED_OUTPATIENT_CLINIC_OR_DEPARTMENT_OTHER): Payer: BC Managed Care – PPO | Admitting: Oncology

## 2019-04-20 ENCOUNTER — Encounter: Payer: Self-pay | Admitting: Oncology

## 2019-04-20 VITALS — BP 140/90 | HR 71 | Temp 97.0°F | Resp 16 | Wt 146.1 lb

## 2019-04-20 DIAGNOSIS — C321 Malignant neoplasm of supraglottis: Secondary | ICD-10-CM | POA: Diagnosis not present

## 2019-04-20 DIAGNOSIS — G893 Neoplasm related pain (acute) (chronic): Secondary | ICD-10-CM | POA: Diagnosis not present

## 2019-04-20 DIAGNOSIS — C139 Malignant neoplasm of hypopharynx, unspecified: Secondary | ICD-10-CM | POA: Diagnosis not present

## 2019-04-20 DIAGNOSIS — C07 Malignant neoplasm of parotid gland: Secondary | ICD-10-CM

## 2019-04-20 LAB — CBC
HCT: 41 % (ref 39.0–52.0)
Hemoglobin: 13.6 g/dL (ref 13.0–17.0)
MCH: 31.5 pg (ref 26.0–34.0)
MCHC: 33.2 g/dL (ref 30.0–36.0)
MCV: 94.9 fL (ref 80.0–100.0)
Platelets: 229 10*3/uL (ref 150–400)
RBC: 4.32 MIL/uL (ref 4.22–5.81)
RDW: 11.9 % (ref 11.5–15.5)
WBC: 4.2 10*3/uL (ref 4.0–10.5)
nRBC: 0 % (ref 0.0–0.2)

## 2019-04-20 NOTE — Progress Notes (Signed)
Pt states that he does not have any trouble swallowing and no pain anymore and none of the pain meds helped and so he does not take any meds now.

## 2019-04-21 ENCOUNTER — Ambulatory Visit
Admission: RE | Admit: 2019-04-21 | Discharge: 2019-04-21 | Disposition: A | Discharge status: Home / Self Care | Payer: BC Managed Care – PPO | Source: Ambulatory Visit | Attending: Radiation Oncology | Admitting: Radiation Oncology

## 2019-04-21 DIAGNOSIS — C139 Malignant neoplasm of hypopharynx, unspecified: Secondary | ICD-10-CM | POA: Diagnosis not present

## 2019-04-23 NOTE — Progress Notes (Signed)
Hematology/Oncology Consult note [facility]  [phone] [phone]  Patient Care Team: Patient, No Pcp Per as PCP - General (General Practice)   Name of the patient: Tyler Holloway  IZ:[id number]  [birth year]   Date of visit: 04/23/19  Diagnosis- squamous cell carcinoma of supraglottic larynx stage II T2 N0 M0  Chief complaint/ Reason for visit-ongoing assessment of throat pain  Heme/Onc history: patient is a 59 year old African-American male who went to the ER on 02/21/2019 with symptoms of sore throat and difficulty swallowing.Here a CT soft tissue neck with contrast in the ER which showed an asymmetric polypoid soft tissue position at the base of left aryepiglottic fold measuring 2.9 x 1.3 cm with partial involvement of the adjacent piriform sinus. Preepiglottic space is preserved. No pathologically enlarged lymph nodes in the neck.Patient states that his symptoms have been ongoing for the last 3 to 4 months. He is able to swallow but reports occasional pain during swallowing. Denies any unintentional weight loss. He is a current smoker and smokes about 1 pack/day and has been doing so over the last 45 years. He also drinks occasional alcohol.  Patient placed seen by Dr. Daisy Blossom and underwent an ENT exam. As per the clinical impression this was a T2 disease.Biopsy showed squamous cell carcinoma  PET CT scan showed hyper intense hypermetabolic activity in the posterior left hypopharynx which localizes to left area epiglottic folds, left piriform sinus and pharyngeal mucosa.  It does not extend into the parapharyngeal space.  No hypermetabolic left or right cervical lymph nodes.  Less intense metabolic activity localized to soft palate and bilateral base of tongue which is symmetric and favored physiologic. Plan is for definitive radiation treatment alone which he started in March 2021  Interval history-patient was given as needed  oxycodone for his throat pain but states it did not help him despite increasing the dose.  He therefore stopped taking narcotics altogether.  He has decided to deal with his pain without any pain medicines.  He reports improvement in his pain since the start of radiation treatment.  Denies other complaints at this time.  He has lost 7 pounds in the last 2 weeks since the start of radiation treatment   ECOG PS- 1 Pain scale- 0   Review of systems- Review of Systems  Constitutional: Positive for malaise/fatigue and weight loss. Negative for chills and fever.  HENT: Negative for congestion, ear discharge and nosebleeds.        Throat pain  Eyes: Negative for blurred vision.  Respiratory: Negative for cough, hemoptysis, sputum production, shortness of breath and wheezing.   Cardiovascular: Negative for chest pain, palpitations, orthopnea and claudication.  Gastrointestinal: Negative for abdominal pain, blood in stool, constipation, diarrhea, heartburn, melena, nausea and vomiting.  Genitourinary: Negative for dysuria, flank pain, frequency, hematuria and urgency.  Musculoskeletal: Negative for back pain, joint pain and myalgias.  Skin: Negative for rash.  Neurological: Negative for dizziness, tingling, focal weakness, seizures, weakness and headaches.  Endo/Heme/Allergies: Does not bruise/bleed easily.  Psychiatric/Behavioral: Negative for depression and suicidal ideas. The patient does not have insomnia.       Allergies  Allergen Reactions  . Pollen Extract Other (See Comments)    sneezing     Past Medical History:  Diagnosis Date  . Hypertension      Past Surgical History:  Procedure Laterality Date  . LARYNGOSCOPY Bilateral 03/02/2019   Procedure: DIRECT LARYNGOSCOPY W/ BX.;  Surgeon: Margaretha Sheffield, MD;  Location:  [place];  Service: ENT;  Laterality: Bilateral;  . NO PAST SURGERIES    . RIGID ESOPHAGOSCOPY Bilateral 03/02/2019   Procedure: RIGID ESOPHAGOSCOPY RIGID  BRONCOSCOPY;  Surgeon: Margaretha Sheffield, MD;  Location: [place];  Service: ENT;  Laterality: Bilateral;    Social History   Socioeconomic History  . Marital status: Single    Spouse name: Not on file  . Number of children: Not on file  . Years of education: Not on file  . Highest education level: Not on file  Occupational History  . Not on file  Tobacco Use  . Smoking status: Current Every Day Smoker    Packs/day: 0.25    Years: 45.00    Pack years: 11.25    Types: Cigarettes  . Smokeless tobacco: Current User  . Tobacco comment: pt says 2-4 cig a day now  Substance and Sexual Activity  . Alcohol use: Yes    Alcohol/week: 7.0 standard drinks    Types: 7 Cans of beer per week  . Drug use: Not on file  . Sexual activity: Not on file  Other Topics Concern  . Not on file  Social History Narrative  . Not on file   Social Determinants of Health   Financial Resource Strain:   . Difficulty of Paying Living Expenses:   Food Insecurity:   . Worried About Charity fundraiser in the Last Year:   . Arboriculturist in the Last Year:   Transportation Needs:   . Film/video editor (Medical):   Marland Kitchen Lack of Transportation (Non-Medical):   Physical Activity:   . Days of Exercise per Week:   . Minutes of Exercise per Session:   Stress:   . Feeling of Stress :   Social Connections:   . Frequency of Communication with Friends and Family:   . Frequency of Social Gatherings with Friends and Family:   . Attends Religious Services:   . Active Member of Clubs or Organizations:   . Attends Archivist Meetings:   Marland Kitchen Marital Status:   Intimate Partner Violence:   . Fear of Current or Ex-Partner:   . Emotionally Abused:   Marland Kitchen Physically Abused:   . Sexually Abused:     Family History  Problem Relation Age of Onset  . Diabetes Mellitus I Mother   . Pancreatic cancer Mother   . Stroke Mother   . Heart Problems Sister   . Thyroid disease Sister   . Hypertension  Sister   . Cervical cancer Sister     No current outpatient medications on file.  Physical exam:  Vitals:   04/20/19 1451  BP: 140/90  Pulse: 71  Resp: 16  Temp: (!) 97 F (36.1 C)  Weight: 146 lb 1.6 oz (66.3 kg)   Physical Exam Cardiovascular:     Rate and Rhythm: Normal rate and regular rhythm.     Heart sounds: Normal heart sounds.  Pulmonary:     Effort: Pulmonary effort is normal.     Breath sounds: Normal breath sounds.  Abdominal:     General: Bowel sounds are normal.     Palpations: Abdomen is soft.  Lymphadenopathy:     Comments: No palpable cervical adenopathy  Skin:    General: Skin is warm and dry.  Neurological:     Mental Status: He is alert and oriented to person, place, and time.      CMP Latest Ref Rng & Units 04/04/2019  Glucose 70 -  99 mg/dL 111(H)  BUN 6 - 20 mg/dL 10  Creatinine 0.61 - 1.24 mg/dL 1.26(H)  Sodium 135 - 145 mmol/L 138  Potassium 3.5 - 5.1 mmol/L 3.7  Chloride 98 - 111 mmol/L 102  CO2 22 - 32 mmol/L 25  Calcium 8.9 - 10.3 mg/dL 9.3  Total Protein 6.0 - 8.5 g/dL -  Total Bilirubin 0.0 - 1.2 mg/dL -  Alkaline Phos 39 - 117 IU/L -  AST 0 - 40 IU/L -  ALT 0 - 44 IU/L -   CBC Latest Ref Rng & Units 04/20/2019  WBC 4.0 - 10.5 K/uL 4.2  Hemoglobin 13.0 - 17.0 g/dL 13.6  Hematocrit 39.0 - 52.0 % 41.0  Platelets 150 - 400 K/uL 229    Assessment and plan- Patient is a 59 y.o. male with squamous cell carcinoma of the supraglottic larynx stage II cT2 N0 M0. he is here for follow-up for ongoing assessment of throat pain  1.  Neoplasm related pain: Patient stopped taking his narcotic pain medicine  after he felt that it was not helping him.  Currently he reports some improvement in his pain after starting radiation treatment.  He is not receiving any concurrent chemotherapy.  Patient will get in touch with [place] if he needs any further assistance with his pain management during the course of his radiation.  2.  I will see him back in June  after getting a repeat PET CT scan with a CBC and CMP   Visit Diagnosis 1. 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 Neoplasm related pain      Dr. Randa Evens, MD, MPH [facility] [id number] 04/23/2019 9:11 AM

## 2019-04-24 ENCOUNTER — Ambulatory Visit
Admission: RE | Admit: 2019-04-24 | Discharge: 2019-04-24 | Disposition: A | Discharge status: Home / Self Care | Payer: BC Managed Care – PPO | Source: Ambulatory Visit | Attending: Radiation Oncology | Admitting: Radiation Oncology

## 2019-04-24 DIAGNOSIS — C139 Malignant neoplasm of hypopharynx, unspecified: Secondary | ICD-10-CM | POA: Diagnosis not present

## 2019-04-25 ENCOUNTER — Ambulatory Visit
Admission: RE | Admit: 2019-04-25 | Discharge: 2019-04-25 | Disposition: A | Discharge status: Home / Self Care | Payer: BC Managed Care – PPO | Source: Ambulatory Visit | Attending: Radiation Oncology | Admitting: Radiation Oncology

## 2019-04-25 DIAGNOSIS — C139 Malignant neoplasm of hypopharynx, unspecified: Secondary | ICD-10-CM | POA: Diagnosis not present

## 2019-04-26 ENCOUNTER — Ambulatory Visit
Admission: RE | Admit: 2019-04-26 | Discharge: 2019-04-26 | Disposition: A | Discharge status: Home / Self Care | Payer: BC Managed Care – PPO | Source: Ambulatory Visit | Attending: Radiation Oncology | Admitting: Radiation Oncology

## 2019-04-26 DIAGNOSIS — C139 Malignant neoplasm of hypopharynx, unspecified: Secondary | ICD-10-CM | POA: Diagnosis not present

## 2019-04-27 ENCOUNTER — Ambulatory Visit
Admission: RE | Admit: 2019-04-27 | Discharge: 2019-04-27 | Disposition: A | Discharge status: Home / Self Care | Payer: BC Managed Care – PPO | Source: Ambulatory Visit | Attending: Radiation Oncology | Admitting: Radiation Oncology

## 2019-04-27 ENCOUNTER — Inpatient Hospital Stay: Payer: BC Managed Care – PPO

## 2019-04-27 ENCOUNTER — Inpatient Hospital Stay: Payer: BC Managed Care – PPO | Attending: Oncology

## 2019-04-27 ENCOUNTER — Other Ambulatory Visit: Payer: Self-pay

## 2019-04-27 DIAGNOSIS — Z510 Encounter for antineoplastic radiation therapy: Secondary | ICD-10-CM | POA: Diagnosis present

## 2019-04-27 DIAGNOSIS — C321 Malignant neoplasm of supraglottis: Secondary | ICD-10-CM | POA: Diagnosis present

## 2019-04-27 DIAGNOSIS — C139 Malignant neoplasm of hypopharynx, unspecified: Secondary | ICD-10-CM | POA: Diagnosis present

## 2019-04-27 DIAGNOSIS — C07 Malignant neoplasm of parotid gland: Secondary | ICD-10-CM

## 2019-04-27 LAB — CBC
HCT: 41.8 % (ref 39.0–52.0)
Hemoglobin: 13.8 g/dL (ref 13.0–17.0)
MCH: 31.2 pg (ref 26.0–34.0)
MCHC: 33 g/dL (ref 30.0–36.0)
MCV: 94.6 fL (ref 80.0–100.0)
Platelets: 217 10*3/uL (ref 150–400)
RBC: 4.42 MIL/uL (ref 4.22–5.81)
RDW: 12.2 % (ref 11.5–15.5)
WBC: 4.9 10*3/uL (ref 4.0–10.5)
nRBC: 0 % (ref 0.0–0.2)

## 2019-04-27 NOTE — Progress Notes (Signed)
Nutrition Follow-up:  Patient with squamous cell carcinoma of larynx.  Patient receiving radiation.   Spoke with patient for nutrition follow-up.  Patient reports pain is still present maybe a little better.  Is not effecting intake.  Patient has not eaten anything so far today (2:40pm).  Reports got up at noon.  Thinks he is going to get ready to eat some pigs feet soon.  Yesterday ate 1 hamburger and fries.  Has not tried oral nutrition supplements.    Medications: reviewed  Labs: no new  Anthropometrics:   Weight 145 lb 1 oz on 3/30 per [facility] decreased from 153 lb 4 oz on 3/9.     NUTRITION DIAGNOSIS: Inadequate oral intake continues   INTERVENTION:  Patient agreeable to taking samples of oral nutrition supplements to try.  RD will leave them at radiation to him to pick up tomorrow 4/2.   Encouraged him to eat more than 1 meal per day.  Patient has contact information Agreeable to RD calling him again for follow-up    MONITORING, EVALUATION, GOAL: Patient will consume adequate calories and protein to prevent weight loss   NEXT VISIT: April 29 phone f/u   Albie Bazin B. Zenia Resides, [place], [place] Registered Dietitian [phone] (pager)

## 2019-04-28 ENCOUNTER — Ambulatory Visit
Admission: RE | Admit: 2019-04-28 | Discharge: 2019-04-28 | Disposition: A | Discharge status: Home / Self Care | Payer: BC Managed Care – PPO | Source: Ambulatory Visit | Attending: Radiation Oncology | Admitting: Radiation Oncology

## 2019-04-28 DIAGNOSIS — C139 Malignant neoplasm of hypopharynx, unspecified: Secondary | ICD-10-CM | POA: Diagnosis not present

## 2019-05-01 ENCOUNTER — Ambulatory Visit
Admission: RE | Admit: 2019-05-01 | Discharge: 2019-05-01 | Disposition: A | Discharge status: Home / Self Care | Payer: BC Managed Care – PPO | Source: Ambulatory Visit | Attending: Radiation Oncology | Admitting: Radiation Oncology

## 2019-05-01 DIAGNOSIS — C139 Malignant neoplasm of hypopharynx, unspecified: Secondary | ICD-10-CM | POA: Diagnosis not present

## 2019-05-02 ENCOUNTER — Ambulatory Visit
Admission: RE | Admit: 2019-05-02 | Discharge: 2019-05-02 | Disposition: A | Discharge status: Home / Self Care | Payer: BC Managed Care – PPO | Source: Ambulatory Visit | Attending: Radiation Oncology | Admitting: Radiation Oncology

## 2019-05-02 DIAGNOSIS — C139 Malignant neoplasm of hypopharynx, unspecified: Secondary | ICD-10-CM | POA: Diagnosis not present

## 2019-05-03 ENCOUNTER — Ambulatory Visit
Admission: RE | Admit: 2019-05-03 | Discharge: 2019-05-03 | Disposition: A | Discharge status: Home / Self Care | Payer: BC Managed Care – PPO | Source: Ambulatory Visit | Attending: Radiation Oncology | Admitting: Radiation Oncology

## 2019-05-03 DIAGNOSIS — C139 Malignant neoplasm of hypopharynx, unspecified: Secondary | ICD-10-CM | POA: Diagnosis not present

## 2019-05-04 ENCOUNTER — Ambulatory Visit
Admission: RE | Admit: 2019-05-04 | Discharge: 2019-05-04 | Disposition: A | Discharge status: Home / Self Care | Payer: BC Managed Care – PPO | Source: Ambulatory Visit | Attending: Radiation Oncology | Admitting: Radiation Oncology

## 2019-05-04 ENCOUNTER — Inpatient Hospital Stay: Payer: BC Managed Care – PPO

## 2019-05-04 ENCOUNTER — Other Ambulatory Visit: Payer: Self-pay

## 2019-05-04 DIAGNOSIS — C07 Malignant neoplasm of parotid gland: Secondary | ICD-10-CM

## 2019-05-04 DIAGNOSIS — C139 Malignant neoplasm of hypopharynx, unspecified: Secondary | ICD-10-CM | POA: Diagnosis not present

## 2019-05-04 DIAGNOSIS — C321 Malignant neoplasm of supraglottis: Secondary | ICD-10-CM | POA: Diagnosis not present

## 2019-05-04 LAB — CBC
HCT: 38.4 % — ABNORMAL LOW (ref 39.0–52.0)
Hemoglobin: 12.6 g/dL — ABNORMAL LOW (ref 13.0–17.0)
MCH: 31.3 pg (ref 26.0–34.0)
MCHC: 32.8 g/dL (ref 30.0–36.0)
MCV: 95.3 fL (ref 80.0–100.0)
Platelets: 161 10*3/uL (ref 150–400)
RBC: 4.03 MIL/uL — ABNORMAL LOW (ref 4.22–5.81)
RDW: 12.9 % (ref 11.5–15.5)
WBC: 4 10*3/uL (ref 4.0–10.5)
nRBC: 0 % (ref 0.0–0.2)

## 2019-05-05 ENCOUNTER — Ambulatory Visit
Admission: RE | Admit: 2019-05-05 | Discharge: 2019-05-05 | Disposition: A | Discharge status: Home / Self Care | Payer: BC Managed Care – PPO | Source: Ambulatory Visit | Attending: Radiation Oncology | Admitting: Radiation Oncology

## 2019-05-05 DIAGNOSIS — C139 Malignant neoplasm of hypopharynx, unspecified: Secondary | ICD-10-CM | POA: Diagnosis not present

## 2019-05-08 ENCOUNTER — Ambulatory Visit
Admission: RE | Admit: 2019-05-08 | Discharge: 2019-05-08 | Disposition: A | Discharge status: Home / Self Care | Payer: BC Managed Care – PPO | Source: Ambulatory Visit | Attending: Radiation Oncology | Admitting: Radiation Oncology

## 2019-05-08 DIAGNOSIS — C139 Malignant neoplasm of hypopharynx, unspecified: Secondary | ICD-10-CM | POA: Diagnosis not present

## 2019-05-09 ENCOUNTER — Ambulatory Visit
Admission: RE | Admit: 2019-05-09 | Discharge: 2019-05-09 | Disposition: A | Discharge status: Home / Self Care | Payer: BC Managed Care – PPO | Source: Ambulatory Visit | Attending: Radiation Oncology | Admitting: Radiation Oncology

## 2019-05-09 DIAGNOSIS — C139 Malignant neoplasm of hypopharynx, unspecified: Secondary | ICD-10-CM | POA: Diagnosis not present

## 2019-05-10 ENCOUNTER — Ambulatory Visit: Payer: BC Managed Care – PPO

## 2019-05-11 ENCOUNTER — Ambulatory Visit
Admission: RE | Admit: 2019-05-11 | Discharge: 2019-05-11 | Disposition: A | Discharge status: Home / Self Care | Payer: BC Managed Care – PPO | Source: Ambulatory Visit | Attending: Radiation Oncology | Admitting: Radiation Oncology

## 2019-05-11 ENCOUNTER — Inpatient Hospital Stay: Payer: BC Managed Care – PPO

## 2019-05-11 ENCOUNTER — Other Ambulatory Visit: Payer: Self-pay

## 2019-05-11 DIAGNOSIS — C321 Malignant neoplasm of supraglottis: Secondary | ICD-10-CM | POA: Diagnosis not present

## 2019-05-11 DIAGNOSIS — C139 Malignant neoplasm of hypopharynx, unspecified: Secondary | ICD-10-CM | POA: Diagnosis not present

## 2019-05-11 DIAGNOSIS — C07 Malignant neoplasm of parotid gland: Secondary | ICD-10-CM

## 2019-05-11 LAB — CBC
HCT: 42.9 % (ref 39.0–52.0)
Hemoglobin: 13.8 g/dL (ref 13.0–17.0)
MCH: 30.9 pg (ref 26.0–34.0)
MCHC: 32.2 g/dL (ref 30.0–36.0)
MCV: 96.2 fL (ref 80.0–100.0)
Platelets: 158 10*3/uL (ref 150–400)
RBC: 4.46 MIL/uL (ref 4.22–5.81)
RDW: 13.2 % (ref 11.5–15.5)
WBC: 4.6 10*3/uL (ref 4.0–10.5)
nRBC: 0 % (ref 0.0–0.2)

## 2019-05-12 ENCOUNTER — Ambulatory Visit
Admission: RE | Admit: 2019-05-12 | Discharge: 2019-05-12 | Disposition: A | Discharge status: Home / Self Care | Payer: BC Managed Care – PPO | Source: Ambulatory Visit | Attending: Radiation Oncology | Admitting: Radiation Oncology

## 2019-05-12 DIAGNOSIS — C139 Malignant neoplasm of hypopharynx, unspecified: Secondary | ICD-10-CM | POA: Diagnosis not present

## 2019-05-15 ENCOUNTER — Ambulatory Visit: Payer: BC Managed Care – PPO

## 2019-05-16 ENCOUNTER — Ambulatory Visit: Payer: BC Managed Care – PPO

## 2019-05-16 ENCOUNTER — Ambulatory Visit: Admission: RE | Admit: 2019-05-16 | Payer: BC Managed Care – PPO | Source: Ambulatory Visit

## 2019-05-17 ENCOUNTER — Ambulatory Visit: Payer: BC Managed Care – PPO

## 2019-05-18 ENCOUNTER — Ambulatory Visit: Payer: BC Managed Care – PPO

## 2019-05-19 ENCOUNTER — Ambulatory Visit: Payer: BC Managed Care – PPO

## 2019-05-22 ENCOUNTER — Other Ambulatory Visit: Payer: Self-pay | Admitting: *Deleted

## 2019-05-22 ENCOUNTER — Ambulatory Visit: Payer: BC Managed Care – PPO

## 2019-05-22 ENCOUNTER — Ambulatory Visit
Admission: RE | Admit: 2019-05-22 | Discharge: 2019-05-22 | Disposition: A | Discharge status: Home / Self Care | Payer: BC Managed Care – PPO | Source: Ambulatory Visit | Attending: Radiation Oncology | Admitting: Radiation Oncology

## 2019-05-22 ENCOUNTER — Telehealth: Payer: Self-pay | Admitting: Hospice and Palliative Medicine

## 2019-05-22 DIAGNOSIS — C139 Malignant neoplasm of hypopharynx, unspecified: Secondary | ICD-10-CM | POA: Diagnosis not present

## 2019-05-22 MED ORDER — OXYCODONE HCL 10 MG PO TABS: 10.0000 mg | ORAL_TABLET | ORAL | 0 refills | Status: DC | PRN

## 2019-05-22 NOTE — Telephone Encounter (Signed)
Patient requested refill of his oxycodone.  I called and spoke with patient.  He reports persistent esophageal pain unrelieved with prior use of Carafate, Magic mouthwash, or MSIR.  He felt oxycodone was more effective.  PDMP reviewed.  He is not had oxycodone dispensed since February.  Explained that we would not to continue both MSIR and oxycodone.  Additionally, I explained that we would not chronically prescribe opioids but would agree to manage through his radiation treatments and then in the immediate period afterwards.  Case and plan discussed with Dr. Janese Banks.  Oxycodone 10 mg every 4 hours as needed (#60).  Rx sent to [facility] on [address].

## 2019-05-23 ENCOUNTER — Other Ambulatory Visit: Payer: Self-pay | Admitting: *Deleted

## 2019-05-23 ENCOUNTER — Ambulatory Visit: Payer: BC Managed Care – PPO

## 2019-05-23 ENCOUNTER — Ambulatory Visit
Admission: RE | Admit: 2019-05-23 | Discharge: 2019-05-23 | Disposition: A | Discharge status: Home / Self Care | Payer: BC Managed Care – PPO | Source: Ambulatory Visit | Attending: Radiation Oncology | Admitting: Radiation Oncology

## 2019-05-23 DIAGNOSIS — C139 Malignant neoplasm of hypopharynx, unspecified: Secondary | ICD-10-CM | POA: Diagnosis not present

## 2019-05-24 ENCOUNTER — Ambulatory Visit
Admission: RE | Admit: 2019-05-24 | Discharge: 2019-05-24 | Disposition: A | Discharge status: Home / Self Care | Payer: BC Managed Care – PPO | Source: Ambulatory Visit | Attending: Radiation Oncology | Admitting: Radiation Oncology

## 2019-05-24 DIAGNOSIS — C139 Malignant neoplasm of hypopharynx, unspecified: Secondary | ICD-10-CM | POA: Diagnosis not present

## 2019-05-25 ENCOUNTER — Ambulatory Visit
Admission: RE | Admit: 2019-05-25 | Discharge: 2019-05-25 | Disposition: A | Discharge status: Home / Self Care | Payer: BC Managed Care – PPO | Source: Ambulatory Visit | Attending: Radiation Oncology | Admitting: Radiation Oncology

## 2019-05-25 ENCOUNTER — Inpatient Hospital Stay: Payer: BC Managed Care – PPO

## 2019-05-25 DIAGNOSIS — C139 Malignant neoplasm of hypopharynx, unspecified: Secondary | ICD-10-CM | POA: Diagnosis not present

## 2019-05-25 NOTE — Progress Notes (Signed)
Nutrition Follow-up:  Patient with squamous cell carcinoma of larynx.  Patient receiving radiation.  Last treatment will be on 5/3.    Spoke with patient via phone.  Patient reports that he tried one of the oral nutrition shakes and like it.  Others are in refrigerator.  Likes chocolate or strawberry flavor the most.  Reports that yesterday he ate macaroni and cheese, squash casserole and baked chicken.  Reports lack of taste and sore throat.    Medications: reviewed  Labs: reviewed  Anthropometrics:   Weight 141 lb 5 oz on 4/27 decreased from 145 lb 1 oz on 3/30 153 lb 4 oz on 3/9   NUTRITION DIAGNOSIS: Inadequate oral intake continues   INTERVENTION:  Patient interested in additional samples of oral nutrition supplements.  RD will provide and take to radiation for him to pick up this afternoon. Contact information given to patient along with coupons    MONITORING, EVALUATION, GOAL: patient will consume adequate calories and protein to prevent weight loss   NEXT VISIT: patient to contact RD if needed  Malayia Spizzirri B. Zenia Resides, [place], [place] Registered Dietitian [phone] (pager)

## 2019-05-26 ENCOUNTER — Ambulatory Visit
Admission: RE | Admit: 2019-05-26 | Discharge: 2019-05-26 | Disposition: A | Discharge status: Home / Self Care | Payer: BC Managed Care – PPO | Source: Ambulatory Visit | Attending: Radiation Oncology | Admitting: Radiation Oncology

## 2019-05-26 DIAGNOSIS — C139 Malignant neoplasm of hypopharynx, unspecified: Secondary | ICD-10-CM | POA: Diagnosis not present

## 2019-05-29 ENCOUNTER — Ambulatory Visit
Admission: RE | Admit: 2019-05-29 | Discharge: 2019-05-29 | Disposition: A | Discharge status: Home / Self Care | Payer: BC Managed Care – PPO | Source: Ambulatory Visit | Attending: Radiation Oncology | Admitting: Radiation Oncology

## 2019-05-29 DIAGNOSIS — Z510 Encounter for antineoplastic radiation therapy: Secondary | ICD-10-CM | POA: Insufficient documentation

## 2019-05-29 DIAGNOSIS — C139 Malignant neoplasm of hypopharynx, unspecified: Secondary | ICD-10-CM | POA: Diagnosis present

## 2019-06-21 ENCOUNTER — Encounter: Payer: Self-pay | Admitting: Radiation Oncology

## 2019-06-21 ENCOUNTER — Other Ambulatory Visit: Payer: Self-pay

## 2019-06-21 ENCOUNTER — Other Ambulatory Visit: Payer: Self-pay | Admitting: *Deleted

## 2019-06-21 ENCOUNTER — Ambulatory Visit
Admission: RE | Admit: 2019-06-21 | Discharge: 2019-06-21 | Disposition: A | Discharge status: Home / Self Care | Payer: BC Managed Care – PPO | Source: Ambulatory Visit | Attending: Radiation Oncology | Admitting: Radiation Oncology

## 2019-06-21 VITALS — BP 160/83 | HR 65 | Temp 98.0°F | Resp 16 | Wt 136.3 lb

## 2019-06-21 DIAGNOSIS — Z923 Personal history of irradiation: Secondary | ICD-10-CM | POA: Diagnosis not present

## 2019-06-21 DIAGNOSIS — C139 Malignant neoplasm of hypopharynx, unspecified: Secondary | ICD-10-CM | POA: Insufficient documentation

## 2019-06-21 DIAGNOSIS — C07 Malignant neoplasm of parotid gland: Secondary | ICD-10-CM

## 2019-06-21 MED ORDER — DEXAMETHASONE 2 MG PO TABS: 2.0000 mg | ORAL_TABLET | Freq: Two times a day (BID) | ORAL | 0 refills | Status: DC

## 2019-06-21 NOTE — Progress Notes (Signed)
Radiation Oncology Follow up Note  Name: Tyler Holloway   Date:   06/21/2019 MRN:  [id number] DOB: [birth year]    This 59 y.o. 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 provider found  HPI: Patient self requested follow-up today he has been having significant head and neck pain last receive refill for narcotic analgesics back at the end of April.  He states swallowing still is difficult and he is in significant pain.  He has Carafate rinses although has been using them sporadically.  He has been evaluated by the symptom management clinic.Marland Kitchen  COMPLICATIONS OF TREATMENT: none  FOLLOW UP COMPLIANCE: keeps appointments   PHYSICAL EXAM:  BP (!) 160/83 (BP Location: Left Arm, Patient Position: Sitting)   Pulse 65   Temp 98 F (36.7 C) (Tympanic)   Resp 16   Wt 136 lb 4.8 oz (61.8 kg)   BMI 16.81 kg/m  Oral cavity is clear no oral mucosal lesions are identified.  Skin is fine.  Well-developed well-nourished patient in NAD. HEENT reveals PERLA, EOMI, discs not visualized.  Oral cavity is clear. No oral mucosal lesions are identified. Neck is clear without evidence of cervical or supraclavicular adenopathy. Lungs are clear to A&P. Cardiac examination is essentially unremarkable with regular rate and rhythm without murmur rub or thrill. Abdomen is benign with no organomegaly or masses noted. Motor sensory and DTR levels are equal and symmetric in the upper and lower extremities. Cranial nerves II through XII are grossly intact. Proprioception is intact. No peripheral adenopathy or edema is identified. No motor or sensory levels are noted. Crude visual fields are within normal range.  RADIOLOGY RESULTS: No current films to review  PLAN: I am starting the patient on 2 mg of Decadron twice a day have also asked him to be compliant about  Carafate rinses at least 2-3 times a day.  I will see him back in 2 weeks for follow-up.  Should he need further narcotic analgesics I will refer him back to symptom management.  I would like to take this opportunity to thank you for allowing me to participate in the care of your patient.Noreene Filbert, MD

## 2019-06-27 ENCOUNTER — Other Ambulatory Visit: Payer: Self-pay | Admitting: *Deleted

## 2019-06-27 MED ORDER — OXYCODONE HCL 10 MG PO TABS: 10.0000 mg | ORAL_TABLET | ORAL | 0 refills | Status: DC | PRN

## 2019-06-27 NOTE — Telephone Encounter (Signed)
Patient is requesting refill of oxycodone. We have previously discussed limitation of chronic prescribing of opioids following completion of treatment. He has now completed RT. Plan is for follow up with Dr. Janese Banks later this month. Will go ahead and refill oxycodone but would recommend consideration for referral to pain management if it appears that patient will need chronic opioids.

## 2019-07-03 ENCOUNTER — Other Ambulatory Visit: Payer: Self-pay

## 2019-07-03 ENCOUNTER — Ambulatory Visit
Admission: RE | Admit: 2019-07-03 | Discharge: 2019-07-03 | Disposition: A | Discharge status: Home / Self Care | Payer: BC Managed Care – PPO | Source: Ambulatory Visit | Attending: Radiation Oncology | Admitting: Radiation Oncology

## 2019-07-03 ENCOUNTER — Encounter: Payer: Self-pay | Admitting: Radiation Oncology

## 2019-07-03 VITALS — BP 127/72 | HR 79 | Temp 97.3°F | Resp 16 | Wt 138.6 lb

## 2019-07-03 DIAGNOSIS — C321 Malignant neoplasm of supraglottis: Secondary | ICD-10-CM

## 2019-07-06 ENCOUNTER — Inpatient Hospital Stay: Payer: BC Managed Care – PPO | Attending: Hospice and Palliative Medicine | Admitting: Hospice and Palliative Medicine

## 2019-07-06 ENCOUNTER — Other Ambulatory Visit: Payer: Self-pay | Admitting: *Deleted

## 2019-07-06 DIAGNOSIS — G893 Neoplasm related pain (acute) (chronic): Secondary | ICD-10-CM

## 2019-07-06 DIAGNOSIS — C321 Malignant neoplasm of supraglottis: Secondary | ICD-10-CM

## 2019-07-06 DIAGNOSIS — Z833 Family history of diabetes mellitus: Secondary | ICD-10-CM | POA: Insufficient documentation

## 2019-07-06 DIAGNOSIS — F17210 Nicotine dependence, cigarettes, uncomplicated: Secondary | ICD-10-CM | POA: Insufficient documentation

## 2019-07-06 DIAGNOSIS — Z923 Personal history of irradiation: Secondary | ICD-10-CM | POA: Insufficient documentation

## 2019-07-06 DIAGNOSIS — I10 Essential (primary) hypertension: Secondary | ICD-10-CM | POA: Insufficient documentation

## 2019-07-06 DIAGNOSIS — Z8349 Family history of other endocrine, nutritional and metabolic diseases: Secondary | ICD-10-CM | POA: Insufficient documentation

## 2019-07-06 DIAGNOSIS — Z7952 Long term (current) use of systemic steroids: Secondary | ICD-10-CM | POA: Insufficient documentation

## 2019-07-06 DIAGNOSIS — Z8249 Family history of ischemic heart disease and other diseases of the circulatory system: Secondary | ICD-10-CM | POA: Insufficient documentation

## 2019-07-06 DIAGNOSIS — Z800 Family history of malignant neoplasm of digestive organs: Secondary | ICD-10-CM | POA: Insufficient documentation

## 2019-07-06 DIAGNOSIS — Z79899 Other long term (current) drug therapy: Secondary | ICD-10-CM | POA: Insufficient documentation

## 2019-07-06 MED ORDER — SUCRALFATE 1 GM/10ML PO SUSP: 1.0000 g | Freq: Three times a day (TID) | ORAL | 2 refills | Status: DC

## 2019-07-06 NOTE — Progress Notes (Signed)
Virtual Visit via Telephone Note  I connected with Tyler Holloway on 07/06/19 at  1:00 PM EDT by telephone and verified that I am speaking with the correct person using two identifiers.   I discussed the limitations, risks, security and privacy concerns of performing an evaluation and management service by telephone and the availability of in person appointments. I also discussed with the patient that there may be a patient responsible charge related to this service. The patient expressed understanding and agreed to proceed.   History of Present Illness: Tyler Holloway is a 59 y.o. 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  PET scan showed hypermetabolic activity in the left hypopharynx.  Patient is status post XRT with curative intent. He has had significant odynophagia.  He was referred to palliative care to help address goals and manage ongoing symptoms.   Observations/Objective: I called and spoke with patient by phone.  Patient continues to endorse persistent moderate to severe esophageal pain.  He is 1 month out from completion of XRT.  He denies dysphagia.  No oral lesions or thrush.  No reflux.  Patient saw Dr. Donella Stade on 06/21/2019 and was restarted on dexamethasone and encouraged to take his Carafate.  Patient tells me today that he has not been using the Carafate as directed.  He only uses it intermittently.  I again instructed him to use the Carafate as directed. He does not have much left and I will refill this. Patient also continues to use oxycodone 4-6 times a day.  Initially he said that this helped and then later in the conversation stated that it does not help much.  Suspect the pain is secondary to effects from radiation.  However, pain should be improving over time.  Discussed with Dr. Janese Banks and will refer back to ENT.  He has follow-up PET scheduled next week and then will see Dr. Janese Banks in the clinic  the following week.  In the interim, he can continue using the Carafate and oxycodone.  Assessment and Plan: Stage II squamous cell of the larynx -status post XRT.  Follow-up PET scan scheduled for 6/15 and then he will see Dr. Janese Banks on 6/21  Odynophagia-refill Carafate.  Continue oxycodone.  Referral to ENT.  Follow Up Instructions: Virtual visit in 2-3 months   I discussed the assessment and treatment plan with the patient. The patient was provided an opportunity to ask questions and all were answered. The patient agreed with the plan and demonstrated an understanding of the instructions.   The patient was advised to call back or seek an in-person evaluation if the symptoms worsen or if the condition fails to improve as anticipated.  I provided 10 minutes of non-face-to-face time during this encounter.   Irean Hong, NP

## 2019-07-10 ENCOUNTER — Other Ambulatory Visit: Payer: Self-pay | Admitting: *Deleted

## 2019-07-10 MED ORDER — OXYCODONE HCL 10 MG PO TABS: 10.0000 mg | ORAL_TABLET | ORAL | 0 refills | Status: DC | PRN

## 2019-07-11 ENCOUNTER — Ambulatory Visit: Admission: RE | Admit: 2019-07-11 | Payer: BC Managed Care – PPO | Source: Ambulatory Visit

## 2019-07-17 ENCOUNTER — Encounter: Payer: Self-pay | Admitting: Oncology

## 2019-07-17 ENCOUNTER — Inpatient Hospital Stay (HOSPITAL_BASED_OUTPATIENT_CLINIC_OR_DEPARTMENT_OTHER): Payer: BC Managed Care – PPO | Admitting: Oncology

## 2019-07-17 ENCOUNTER — Other Ambulatory Visit: Payer: Self-pay

## 2019-07-17 ENCOUNTER — Ambulatory Visit: Payer: BC Managed Care – PPO | Admitting: Radiation Oncology

## 2019-07-17 VITALS — BP 126/88 | HR 67 | Temp 99.1°F | Resp 16 | Wt 134.2 lb

## 2019-07-17 DIAGNOSIS — Z8249 Family history of ischemic heart disease and other diseases of the circulatory system: Secondary | ICD-10-CM | POA: Diagnosis not present

## 2019-07-17 DIAGNOSIS — Z923 Personal history of irradiation: Secondary | ICD-10-CM | POA: Diagnosis not present

## 2019-07-17 DIAGNOSIS — Z08 Encounter for follow-up examination after completed treatment for malignant neoplasm: Secondary | ICD-10-CM | POA: Diagnosis not present

## 2019-07-17 DIAGNOSIS — C321 Malignant neoplasm of supraglottis: Secondary | ICD-10-CM | POA: Diagnosis present

## 2019-07-17 DIAGNOSIS — F17210 Nicotine dependence, cigarettes, uncomplicated: Secondary | ICD-10-CM | POA: Diagnosis not present

## 2019-07-17 DIAGNOSIS — Z8589 Personal history of malignant neoplasm of other organs and systems: Secondary | ICD-10-CM | POA: Diagnosis not present

## 2019-07-17 DIAGNOSIS — Z8349 Family history of other endocrine, nutritional and metabolic diseases: Secondary | ICD-10-CM | POA: Diagnosis not present

## 2019-07-17 DIAGNOSIS — Z800 Family history of malignant neoplasm of digestive organs: Secondary | ICD-10-CM | POA: Diagnosis not present

## 2019-07-17 DIAGNOSIS — Z7952 Long term (current) use of systemic steroids: Secondary | ICD-10-CM | POA: Diagnosis not present

## 2019-07-17 DIAGNOSIS — I10 Essential (primary) hypertension: Secondary | ICD-10-CM | POA: Diagnosis not present

## 2019-07-17 DIAGNOSIS — Z833 Family history of diabetes mellitus: Secondary | ICD-10-CM | POA: Diagnosis not present

## 2019-07-17 DIAGNOSIS — Z79899 Other long term (current) drug therapy: Secondary | ICD-10-CM | POA: Diagnosis not present

## 2019-07-17 MED ORDER — PANTOPRAZOLE SODIUM 20 MG PO TBEC
20.0000 mg | DELAYED_RELEASE_TABLET | Freq: Every day | ORAL | 1 refills | Status: DC
Start: 2019-07-17 — End: 2019-12-05

## 2019-07-17 NOTE — Progress Notes (Signed)
12 lbs wt loss since last visit. Pt says it hurts to swallow, and talk. He drinks 1/2 pint of liquor a day and then 7 beers a week. Drinks water and eats food and it hurts everytime he puts it in his mouth. Lives with neice

## 2019-07-19 ENCOUNTER — Telehealth: Payer: Self-pay | Admitting: *Deleted

## 2019-07-19 NOTE — Telephone Encounter (Signed)
Patient called back and I informed him of Dr Elroy Channel response. He said that he agrees to have the PET Scan and that ENT appointment has already been rescheduled to 07/25/19

## 2019-07-19 NOTE — Telephone Encounter (Addendum)
Call returned to patient and I had to leave message on voice mail for patient to call me back. I will await his call to inform him of doctor response

## 2019-07-19 NOTE — Telephone Encounter (Signed)
Patient is 2 months out of chemo and radiation treatment and is still complaining of pain in his throat.  He did not show up for his ENT appointment as well as PET scan.  He is also consuming significant amounts of alcohol.  I had asked him to stop his alcohol Protonix to see if it helps with heartburn and pain.  Until he gets his PET scan ENT evaluation, I am hesitant to prescribe him long-term opioids

## 2019-07-19 NOTE — Telephone Encounter (Signed)
Patient called reporting that he was to have pain medicine sent to pharmacy yesterday for his throat pain and that the pharmacy has not received anything for him. Please Tyler Holloway

## 2019-07-20 ENCOUNTER — Telehealth: Payer: Self-pay | Admitting: *Deleted

## 2019-07-20 NOTE — Telephone Encounter (Signed)
Called Dr. Kathyrn Sheriff office to get a reschedule for the patient and they have rescheduled it to June 28 at 145 in the [facility] office for Dr.Juengel.  Patient has an appointment to get a PET scan on June 29 at 1:45 , nothing to eat or drink 6 hours prior to scan.  Then patient has to come see Dr. Janese Banks on July 1 at 215 to go over the results and see what his plan will be going forward.  Patient got someone to call me back and write down all of this so that patient will not forget to go

## 2019-07-20 NOTE — Progress Notes (Signed)
Hematology/Oncology Consult note [facility]  [phone] [phone]  Patient Care Team: Patient, No Pcp Per as PCP - General (General Practice)   Name of the patient: Tyler Holloway  [id number]  1960-08-17   Date of visit: 07/20/19  Diagnosis- squamous cell carcinoma of supraglottic larynx stage II T2 N0 M0 s/p radiation  Chief complaint/ Reason for visit-routine follow-up of head and neck cancer  Heme/Onc history: patient is a 59 year old African-American male who went to the ER on 02/21/2019 with symptoms of sore throat and difficulty swallowing.Here a CT soft tissue neck with contrast in the ER which showed an asymmetric polypoid soft tissue position at the base of left aryepiglottic fold measuring 2.9 x 1.3 cm with partial involvement of the adjacent piriform sinus. Preepiglottic space is preserved. No pathologically enlarged lymph nodes in the neck.Patient states that his symptoms have been ongoing for the last 3 to 4 months. He is able to swallow but reports occasional pain during swallowing. Denies any unintentional weight loss. He is a current smoker and smokes about 1 pack/day and has been doing so over the last 45 years. He also drinks occasional alcohol.  Patient placed seen by Dr. Daisy Blossom and underwent an ENT exam. As per the clinical impression this was a T2 disease.Biopsy showed squamous cell carcinoma  PET CT scan showed hyper intense hypermetabolic activity in the posterior left hypopharynx which localizes to left area epiglottic folds, left piriform sinus and pharyngeal mucosa. It does not extend into the parapharyngeal space. No hypermetabolic left or right cervical lymph nodes. Less intense metabolic activity localized to soft palate and bilateral base of tongue which is symmetric and favored physiologic.  Patient completed definitive radiation treatment on 05/29/2019  Interval history-patient is reporting ongoing  throat pain and wants refill of his pain medications.  He did not show up for his PET scan or his ENT appointment.  He was last given oxycodone 10 mg every 4 hours on 07/10/2019.  Reports that he is still using his pain medicines every 4 hours which helps him with pain to some extent but does not take it away.  He continues to drink alcohol every day  ECOG PS- 1 Pain scale- 5 Opioid associated constipation- no  Review of systems- Review of Systems  Constitutional: Negative for chills, fever, malaise/fatigue and weight loss.  HENT: Negative for congestion, ear discharge and nosebleeds.        Throat pain and difficulty swallowing  Eyes: Negative for blurred vision.  Respiratory: Negative for cough, hemoptysis, sputum production, shortness of breath and wheezing.   Cardiovascular: Negative for chest pain, palpitations, orthopnea and claudication.  Gastrointestinal: Negative for abdominal pain, blood in stool, constipation, diarrhea, heartburn, melena, nausea and vomiting.  Genitourinary: Negative for dysuria, flank pain, frequency, hematuria and urgency.  Musculoskeletal: Negative for back pain, joint pain and myalgias.  Skin: Negative for rash.  Neurological: Negative for dizziness, tingling, focal weakness, seizures, weakness and headaches.  Endo/Heme/Allergies: Does not bruise/bleed easily.  Psychiatric/Behavioral: Negative for depression and suicidal ideas. The patient does not have insomnia.       Allergies  Allergen Reactions  . Pollen Extract Other (See Comments)    sneezing     Past Medical History:  Diagnosis Date  . Hypertension      Past Surgical History:  Procedure Laterality Date  . LARYNGOSCOPY Bilateral 03/02/2019   Procedure: DIRECT LARYNGOSCOPY W/ BX.;  Surgeon: Margaretha Sheffield, MD;  Location: [facility];  Service:  ENT;  Laterality: Bilateral;  . NO PAST SURGERIES    . RIGID ESOPHAGOSCOPY Bilateral 03/02/2019   Procedure: RIGID ESOPHAGOSCOPY RIGID  BRONCOSCOPY;  Surgeon: Margaretha Sheffield, MD;  Location: [place];  Service: ENT;  Laterality: Bilateral;    Social History   Socioeconomic History  . Marital status: Single    Spouse name: Not on file  . Number of children: Not on file  . Years of education: Not on file  . Highest education level: Not on file  Occupational History  . Not on file  Tobacco Use  . Smoking status: Current Every Day Smoker    Packs/day: 0.25    Years: 45.00    Pack years: 11.25    Types: Cigarettes  . Smokeless tobacco: Current User  . Tobacco comment: 10 cig a day  Vaping Use  . Vaping Use: Never used  Substance and Sexual Activity  . Alcohol use: Yes    Alcohol/week: 7.0 standard drinks    Types: 7 Cans of beer per week    Comment: 1/2 pint a day of liquor  . Drug use: Not Currently  . Sexual activity: Not Currently  Other Topics Concern  . Not on file  Social History Narrative  . Not on file   Social Determinants of Health   Financial Resource Strain:   . Difficulty of Paying Living Expenses:   Food Insecurity:   . Worried About Charity fundraiser in the Last Year:   . Arboriculturist in the Last Year:   Transportation Needs:   . Film/video editor (Medical):   Marland Kitchen Lack of Transportation (Non-Medical):   Physical Activity:   . Days of Exercise per Week:   . Minutes of Exercise per Session:   Stress:   . Feeling of Stress :   Social Connections:   . Frequency of Communication with Friends and Family:   . Frequency of Social Gatherings with Friends and Family:   . Attends Religious Services:   . Active Member of Clubs or Organizations:   . Attends Archivist Meetings:   Marland Kitchen Marital Status:   Intimate Partner Violence:   . Fear of Current or Ex-Partner:   . Emotionally Abused:   Marland Kitchen Physically Abused:   . Sexually Abused:     Family History  Problem Relation Age of Onset  . Diabetes Mellitus I Mother   . Pancreatic cancer Mother   . Stroke Mother   .  Heart Problems Sister   . Thyroid disease Sister   . Hypertension Sister   . Cervical cancer Sister      Current Outpatient Medications:  .  Oxycodone HCl 10 MG TABS, Take 1 tablet (10 mg total) by mouth every 4 (four) hours as needed (pain)., Disp: 60 tablet, Rfl: 0 .  dexamethasone (DECADRON) 2 MG tablet, Take 1 tablet (2 mg total) by mouth 2 (two) times daily. (Patient not taking: Reported on 07/17/2019), Disp: 40 tablet, Rfl: 0 .  pantoprazole (PROTONIX) 20 MG tablet, Take 1 tablet (20 mg total) by mouth daily., Disp: 30 tablet, Rfl: 1 .  sucralfate (CARAFATE) 1 GM/10ML suspension, Take 10 mLs (1 g total) by mouth 4 (four) times daily -  with meals and at bedtime. (Patient not taking: Reported on 07/17/2019), Disp: 420 mL, Rfl: 2  Physical exam:  Vitals:   07/17/19 1337  BP: 126/88  Pulse: 67  Resp: 16  Temp: 99.1 F (37.3 C)  TempSrc: Tympanic  Weight: 134  lb 3.2 oz (60.9 kg)   Physical Exam Constitutional:      General: He is not in acute distress. Cardiovascular:     Rate and Rhythm: Normal rate and regular rhythm.     Heart sounds: Normal heart sounds.  Pulmonary:     Effort: Pulmonary effort is normal.     Breath sounds: Normal breath sounds.  Abdominal:     General: Bowel sounds are normal.     Palpations: Abdomen is soft.  Musculoskeletal:     Cervical back: Normal range of motion.  Lymphadenopathy:     Comments: No palpable cervical adenopathy  Skin:    General: Skin is warm and dry.  Neurological:     Mental Status: He is alert and oriented to person, place, and time.      CMP Latest Ref Rng & Units 04/04/2019  Glucose 70 - 99 mg/dL 111(H)  BUN 6 - 20 mg/dL 10  Creatinine 0.61 - 1.24 mg/dL 1.26(H)  Sodium 135 - 145 mmol/L 138  Potassium 3.5 - 5.1 mmol/L 3.7  Chloride 98 - 111 mmol/L 102  CO2 22 - 32 mmol/L 25  Calcium 8.9 - 10.3 mg/dL 9.3  Total Protein 6.0 - 8.5 g/dL -  Total Bilirubin 0.0 - 1.2 mg/dL -  Alkaline Phos 39 - 117 IU/L -  AST 0 - 40  IU/L -  ALT 0 - 44 IU/L -   CBC Latest Ref Rng & Units 05/11/2019  WBC 4.0 - 10.5 K/uL 4.6  Hemoglobin 13.0 - 17.0 g/dL 13.8  Hematocrit 39 - 52 % 42.9  Platelets 150 - 400 K/uL 158     Assessment and plan- Patient is a 59 y.o. male with squamous cell carcinoma of the supraglottic larynx stage II cT2 N0 M0 s/p definitive radiation treatment ending in May 2021.  He is here for follow-up  Patient was given 60 tablets of oxycodone on 07/10/2019.  He reports taking oxycodone every 4 hours but still reports ongoing pain.  I explained to the patient and he needs to show up for his PET CT scan as well as ENT appointment to a certain the status of his cancer before we can give him more pain medications.  He has a history of opioid abuse as well as ongoing alcohol abuse.  It would be unusual to have significant pain 2 months after radiation treatment.  I have asked him to stop his alcohol intake.  I suspect patient has an element of esophagitis and gastritis from ongoing alcohol intake as well.  I will give him a prescription for Protonix at this time.  I will see him back after his PET and ENT appointment and decide what needs to be done moving forward with regards to his pain   Visit Diagnosis 1. Encounter for follow-up surveillance of head and neck cancer      Dr. Randa Evens, MD, MPH [facility] [phone] 07/20/2019 12:11 PM

## 2019-07-25 ENCOUNTER — Encounter
Admission: RE | Admit: 2019-07-25 | Discharge: 2019-07-25 | Disposition: A | Discharge status: Home / Self Care | Payer: BC Managed Care – PPO | Source: Ambulatory Visit | Attending: Oncology | Admitting: Oncology

## 2019-07-25 ENCOUNTER — Other Ambulatory Visit: Payer: Self-pay

## 2019-07-25 DIAGNOSIS — C321 Malignant neoplasm of supraglottis: Secondary | ICD-10-CM | POA: Insufficient documentation

## 2019-07-25 LAB — GLUCOSE, CAPILLARY: Glucose-Capillary: 120 mg/dL — ABNORMAL HIGH (ref 70–99)

## 2019-07-25 MED ORDER — FLUDEOXYGLUCOSE F - 18 (FDG) INJECTION
6.9000 | Freq: Once | INTRAVENOUS | Status: AC | PRN
  Administered 2019-07-25: 7.36 via INTRAVENOUS

## 2019-07-27 ENCOUNTER — Other Ambulatory Visit: Payer: Self-pay

## 2019-07-27 ENCOUNTER — Inpatient Hospital Stay: Payer: BC Managed Care – PPO | Attending: Oncology | Admitting: Oncology

## 2019-07-27 ENCOUNTER — Encounter: Payer: Self-pay | Admitting: Oncology

## 2019-07-27 VITALS — BP 137/81 | HR 104 | Temp 98.6°F | Resp 18 | Wt 137.1 lb

## 2019-07-27 DIAGNOSIS — Z800 Family history of malignant neoplasm of digestive organs: Secondary | ICD-10-CM | POA: Diagnosis not present

## 2019-07-27 DIAGNOSIS — C321 Malignant neoplasm of supraglottis: Secondary | ICD-10-CM | POA: Diagnosis not present

## 2019-07-27 DIAGNOSIS — Z8049 Family history of malignant neoplasm of other genital organs: Secondary | ICD-10-CM | POA: Insufficient documentation

## 2019-07-27 DIAGNOSIS — Z833 Family history of diabetes mellitus: Secondary | ICD-10-CM | POA: Insufficient documentation

## 2019-07-27 DIAGNOSIS — Z7952 Long term (current) use of systemic steroids: Secondary | ICD-10-CM | POA: Insufficient documentation

## 2019-07-27 DIAGNOSIS — Z79899 Other long term (current) drug therapy: Secondary | ICD-10-CM | POA: Insufficient documentation

## 2019-07-27 DIAGNOSIS — I10 Essential (primary) hypertension: Secondary | ICD-10-CM | POA: Insufficient documentation

## 2019-07-27 DIAGNOSIS — F17210 Nicotine dependence, cigarettes, uncomplicated: Secondary | ICD-10-CM | POA: Insufficient documentation

## 2019-07-27 DIAGNOSIS — Z8349 Family history of other endocrine, nutritional and metabolic diseases: Secondary | ICD-10-CM | POA: Insufficient documentation

## 2019-07-27 DIAGNOSIS — Z923 Personal history of irradiation: Secondary | ICD-10-CM | POA: Insufficient documentation

## 2019-07-27 DIAGNOSIS — Z8249 Family history of ischemic heart disease and other diseases of the circulatory system: Secondary | ICD-10-CM | POA: Insufficient documentation

## 2019-07-27 DIAGNOSIS — R070 Pain in throat: Secondary | ICD-10-CM | POA: Diagnosis not present

## 2019-07-27 MED ORDER — OXYCODONE HCL 10 MG PO TABS: 10.0000 mg | ORAL_TABLET | Freq: Four times a day (QID) | ORAL | 0 refills | Status: AC | PRN

## 2019-07-27 NOTE — Progress Notes (Signed)
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 Preepiglottic space is preserved. No pathologically enlarged lymph nodes in the neck.Patient states that his symptoms have been ongoing for the last 3 to 4 months. He is able to swallow but reports occasional pain during swallowing. Denies any unintentional weight loss. He is a current smoker and smokes about 1 pack/day and has been doing so over the last 45 years. He also drinks occasional alcohol.  Patient placed seen by Dr. Daisy Blossom and underwent an ENT exam. As per the clinical impression this was a T2 disease.Biopsy showed squamous cell carcinoma  PET CT scan showed hyper intense hypermetabolic activity in the posterior left hypopharynx which localizes to left area epiglottic folds, left piriform sinus and pharyngeal mucosa. It does not extend into the parapharyngeal space. No hypermetabolic left or right cervical lymph nodes. Less intense metabolic activity localized to soft palate and bilateral base of tongue which is symmetric and favored physiologic.  Patient completed definitive radiation treatment on 5/3/2021PET scan on 07/25/2019 showed resolution of  hypermetabolic supraglottic mass.  Residual asymmetric hypermetabolic activity involving the left larynx and had a benign cartilage.  No metastatic adenopathy in the neck or elsewhere.  Patient was also seen by Dr. Eden Lathe laryngoscopy showed fixed left adenoid with no mobility of the left cord.  Normal mobility of the right vocal cord.  No evidence of persisting or recurrent disease.   Interval history-patient complains of ongoing hoarseness of voice and difficulty swallowing which he says has worsened over the last 2 weeks.  ECOG PS- 1 Pain scale- 9  Review of systems- Review of Systems  Constitutional: Negative for chills, fever, malaise/fatigue and weight loss.  HENT: Negative for congestion, ear discharge and nosebleeds.        Throat pain and pain during swallowing  Eyes: Negative for blurred vision.  Respiratory: Negative for cough, hemoptysis, sputum production, shortness of breath and wheezing.   Cardiovascular: Negative for chest pain, palpitations, orthopnea and claudication.  Gastrointestinal: Negative for abdominal pain, blood in stool, constipation, diarrhea, heartburn, melena, nausea and vomiting.  Genitourinary: Negative for dysuria, flank pain, frequency, hematuria and urgency.  Musculoskeletal: Negative for back pain, joint pain and myalgias.  Skin: Negative for rash.  Neurological: Negative for dizziness, tingling, focal weakness, seizures, weakness and headaches.  Endo/Heme/Allergies: Does not bruise/bleed easily.  Psychiatric/Behavioral: Negative for depression and suicidal ideas. The patient does not have insomnia.       Allergies  Allergen Reactions  . Pollen Extract Other (See Comments)    sneezing     Past Medical History:  Diagnosis Date  . Hypertension      Past Surgical History:  Procedure Laterality Date  . LARYNGOSCOPY Bilateral 03/02/2019   Procedure: DIRECT LARYNGOSCOPY W/ BX.;  Surgeon:  Margaretha Sheffield, MD;  Location: [place];   Service: ENT;  Laterality: Bilateral;  . NO PAST SURGERIES    . RIGID ESOPHAGOSCOPY Bilateral 03/02/2019   Procedure: RIGID ESOPHAGOSCOPY RIGID BRONCOSCOPY;  Surgeon: Margaretha Sheffield, MD;  Location: [place];  Service: ENT;  Laterality: Bilateral;    Social History   Socioeconomic History  . Marital status: Single    Spouse name: Not on file  . Number of children: Not on file  . Years of education: Not on file  . Highest education level: Not on file  Occupational History  . Not on file  Tobacco Use  . Smoking status: Current Every Day Smoker    Packs/day: 0.25    Years: 45.00    Pack years: 11.25    Types: Cigarettes  . Smokeless tobacco: Current User  . Tobacco comment: 10 cig a day  Vaping Use  . Vaping Use: Never used  Substance and Sexual Activity  . Alcohol use: Yes    Alcohol/week: 7.0 standard drinks    Types: 7 Cans of beer per week    Comment: 1/2 pint a day of liquor  . Drug use: Not Currently  . Sexual activity: Not Currently  Other Topics Concern  . Not on file  Social History Narrative  . Not on file   Social Determinants of Health   Financial Resource Strain:   . Difficulty of Paying Living Expenses:   Food Insecurity:   . Worried About Charity fundraiser in the Last Year:   . Arboriculturist in the Last Year:   Transportation Needs:   . Film/video editor (Medical):   Marland Kitchen Lack of Transportation (Non-Medical):   Physical Activity:   . Days of Exercise per Week:   . Minutes of Exercise per Session:   Stress:   . Feeling of Stress :   Social Connections:   . Frequency of Communication with Friends and Family:   . Frequency of Social Gatherings with Friends and Family:   . Attends Religious Services:   . Active Member of Clubs or Organizations:   . Attends Archivist Meetings:   Marland Kitchen Marital Status:   Intimate Partner Violence:   . Fear of Current or Ex-Partner:   . Emotionally Abused:   Marland Kitchen Physically Abused:   . Sexually  Abused:     Family History  Problem Relation Age of Onset  . Diabetes Mellitus I Mother   . Pancreatic cancer Mother   . Stroke Mother   . Heart Problems Sister   . Thyroid disease Sister   . Hypertension Sister   . Cervical cancer Sister      Current Outpatient Medications:  .  Oxycodone HCl 10 MG TABS, Take 1 tablet (10 mg total) by mouth every 4 (four) hours as needed (pain)., Disp: 60 tablet, Rfl: 0 .  pantoprazole (PROTONIX) 20 MG tablet, Take 1 tablet (20 mg total) by mouth daily., Disp: 30 tablet, Rfl: 1 .  dexamethasone (DECADRON) 2 MG tablet, Take 1 tablet (2 mg total) by mouth 2 (two) times daily. (Patient not taking: Reported on 07/17/2019), Disp: 40 tablet, Rfl: 0 .  sucralfate (CARAFATE) 1 GM/10ML suspension, Take 10 mLs (1 g total) by mouth 4 (four) times daily -  with meals and at bedtime. (Patient not taking: Reported on 07/17/2019), Disp: 420 mL, Rfl: 2  Physical exam:  Vitals:   07/27/19 1417  BP: 137/81  Pulse: (!) 104  Resp: 18  Temp: 98.6 F (37 C)  TempSrc: Tympanic  SpO2: 99%  Weight: 137 lb 1.6 oz (62.2 kg)   Physical Exam Constitutional:      General: He is not in acute distress. Pulmonary:     Effort: Pulmonary effort is normal.  Skin:    General: Skin is warm and dry.  Neurological:     Mental Status: He is alert and oriented to person, place, and time.      CMP Latest Ref Rng & Units 04/04/2019  Glucose 70 - 99 mg/dL 111(H)  BUN 6 - 20 mg/dL 10  Creatinine 0.61 - 1.24 mg/dL 1.26(H)  Sodium 135 - 145 mmol/L 138  Potassium 3.5 - 5.1 mmol/L 3.7  Chloride 98 - 111 mmol/L 102  CO2 22 - 32 mmol/L 25  Calcium 8.9 - 10.3 mg/dL 9.3  Total Protein 6.0 - 8.5 g/dL -  Total Bilirubin 0.0 - 1.2 mg/dL -  Alkaline Phos 39 - 117 IU/L -  AST 0 - 40 IU/L -  ALT 0 - 44 IU/L -   CBC Latest Ref Rng & Units 05/11/2019  WBC 4.0 - 10.5 K/uL 4.6  Hemoglobin 13.0 - 17.0 g/dL 13.8  Hematocrit 39 - 52 % 42.9  Platelets 150 - 400 K/uL 158    No images are  attached to the encounter.  NM PET Image Restag (PS) Skull Base To Thigh  Result Date: 07/25/2019 CLINICAL DATA:  Subsequent treatment strategy for head neck carcinoma. Squamous cell carcinoma supraglottic larynx. Post radiation therapy. EXAM: NUCLEAR MEDICINE PET SKULL BASE TO THIGH TECHNIQUE: 7.5 mCi F-18 FDG was injected intravenously. Full-ring PET imaging was performed from the skull base to thigh after the radiotracer. CT data was obtained and used for attenuation correction and anatomic localization. Fasting blood glucose:  120 mg/dl COMPARISON:  PET-CT 03/21/2019 FINDINGS: Mediastinal blood pool activity: SUV max 1.9 Liver activity: SUV max NA NECK: Resolution of the intense hypermetabolic tissue in the supraglottic space. There is however residual hypermetabolic activity associated with the LEFT larynx extending to the LEFT arytenoid cartilage with SUV max equal 6.1. There are no hypermetabolic lymph nodes in the LEFT or RIGHT neck. Incidental CT findings: none CHEST: No hypermetabolic mediastinal lymph nodes. No suspicious pulmonary nodules. Incidental CT findings: Coronary artery calcification and aortic atherosclerotic calcification. ABDOMEN/PELVIS: No abnormal hypermetabolic activity within the liver, pancreas, adrenal glands, or spleen. No hypermetabolic lymph nodes in the abdomen or pelvis. Incidental CT findings: Atherosclerotic calcification of the aorta. SKELETON: No focal hypermetabolic activity to suggest skeletal metastasis. Incidental CT findings: None IMPRESSION: 1. Resolution of the hypermetabolic supraglottic mass. 2. Residual asymmetric hypermetabolic activity localizing LEFT larynx and arytenoid cartilage. Recommend clinical correlation. 3. No evidence of metastatic adenopathy in the neck. 4. No evidence distant metastatic disease. 5. Coronary artery calcification and Aortic Atherosclerosis (ICD10-I70.0). Electronically Signed   By: Suzy Bouchard M.D.   On: 07/25/2019 16:07      Assessment and plan- Patient is a 59 y.o. male with squamous cell carcinoma of the supraglottic larynx stage II cT2 N0 M0 s/p definitive radiation treatment ending in May 2021.   PET CT scan shows resolution of the Supraglottic mass with there is some hypermetabolism in the left larynx/arytenoid cartilage.  On laryngeal exam normal evidence of persistent or recurrent tumor noted by Dr. Kathyrn Sheriff.  At this time I am inclined to monitor this conservatively and plan to get a repeat PET scan after 4 months.  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  As far as pain and difficulty swallowing goes I will send him a prescription for Magic mouthwash and I am willing to give him a one-time 2-week prescription of as needed oxycodone but I am unwilling to give him long-term pain medications in the absence of overt evidence for tumor recurrence.  Patient understands that after 2 weeks he will not be getting any narcotic pain medications from me and can take as needed Tylenol or NSAIDs for symptomatic relief   Visit Diagnosis 1. 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 Throat pain      Dr. Randa Evens, MD, MPH [facility] [phone] 07/27/2019 2:37 PM

## 2019-07-28 ENCOUNTER — Telehealth: Payer: Self-pay

## 2019-07-28 ENCOUNTER — Telehealth: Payer: Self-pay | Admitting: *Deleted

## 2019-07-28 NOTE — Telephone Encounter (Signed)
Request for Medicated Mouth Wash received from [facility]

## 2019-07-28 NOTE — Telephone Encounter (Signed)
We did fax it, let me check on it.

## 2019-07-28 NOTE — Telephone Encounter (Signed)
Can you look into this? I though we faxed it yesterday?

## 2019-07-28 NOTE — Telephone Encounter (Signed)
I called pt and let him know that his insurance doesn't cover the medication. I told him the cash price. Pt declines the medication he said that it wasn't that effective previously and he would prefer not to pay cash. Pt is willing to have an alternative that his insurance covers.sjc

## 2019-08-12 ENCOUNTER — Other Ambulatory Visit: Payer: Self-pay

## 2019-08-12 ENCOUNTER — Inpatient Hospital Stay
Admission: EM | Admit: 2019-08-12 | Discharge: 2019-08-13 | DRG: 178 | Discharge status: Against Medical Advice | Payer: BC Managed Care – PPO | Attending: Internal Medicine | Admitting: Internal Medicine

## 2019-08-12 DIAGNOSIS — Z833 Family history of diabetes mellitus: Secondary | ICD-10-CM

## 2019-08-12 DIAGNOSIS — Z20822 Contact with and (suspected) exposure to covid-19: Secondary | ICD-10-CM | POA: Diagnosis present

## 2019-08-12 DIAGNOSIS — I700 Atherosclerosis of aorta: Secondary | ICD-10-CM | POA: Diagnosis present

## 2019-08-12 DIAGNOSIS — K219 Gastro-esophageal reflux disease without esophagitis: Secondary | ICD-10-CM | POA: Diagnosis present

## 2019-08-12 DIAGNOSIS — F1410 Cocaine abuse, uncomplicated: Secondary | ICD-10-CM | POA: Diagnosis present

## 2019-08-12 DIAGNOSIS — Z8349 Family history of other endocrine, nutritional and metabolic diseases: Secondary | ICD-10-CM

## 2019-08-12 DIAGNOSIS — J690 Pneumonitis due to inhalation of food and vomit: Secondary | ICD-10-CM | POA: Diagnosis not present

## 2019-08-12 DIAGNOSIS — J439 Emphysema, unspecified: Secondary | ICD-10-CM | POA: Diagnosis present

## 2019-08-12 DIAGNOSIS — F17210 Nicotine dependence, cigarettes, uncomplicated: Secondary | ICD-10-CM | POA: Diagnosis present

## 2019-08-12 DIAGNOSIS — J189 Pneumonia, unspecified organism: Secondary | ICD-10-CM

## 2019-08-12 DIAGNOSIS — B1910 Unspecified viral hepatitis B without hepatic coma: Secondary | ICD-10-CM | POA: Diagnosis present

## 2019-08-12 DIAGNOSIS — Z800 Family history of malignant neoplasm of digestive organs: Secondary | ICD-10-CM

## 2019-08-12 DIAGNOSIS — N400 Enlarged prostate without lower urinary tract symptoms: Secondary | ICD-10-CM | POA: Diagnosis present

## 2019-08-12 DIAGNOSIS — I10 Essential (primary) hypertension: Secondary | ICD-10-CM | POA: Diagnosis present

## 2019-08-12 DIAGNOSIS — B1920 Unspecified viral hepatitis C without hepatic coma: Secondary | ICD-10-CM | POA: Diagnosis present

## 2019-08-12 DIAGNOSIS — G629 Polyneuropathy, unspecified: Secondary | ICD-10-CM | POA: Diagnosis present

## 2019-08-12 DIAGNOSIS — J029 Acute pharyngitis, unspecified: Secondary | ICD-10-CM | POA: Diagnosis not present

## 2019-08-12 DIAGNOSIS — Z823 Family history of stroke: Secondary | ICD-10-CM

## 2019-08-12 DIAGNOSIS — Z5329 Procedure and treatment not carried out because of patient's decision for other reasons: Secondary | ICD-10-CM | POA: Diagnosis present

## 2019-08-12 DIAGNOSIS — Z8249 Family history of ischemic heart disease and other diseases of the circulatory system: Secondary | ICD-10-CM

## 2019-08-12 DIAGNOSIS — Z923 Personal history of irradiation: Secondary | ICD-10-CM

## 2019-08-12 DIAGNOSIS — C140 Malignant neoplasm of pharynx, unspecified: Secondary | ICD-10-CM

## 2019-08-12 DIAGNOSIS — R042 Hemoptysis: Secondary | ICD-10-CM

## 2019-08-12 DIAGNOSIS — Z8049 Family history of malignant neoplasm of other genital organs: Secondary | ICD-10-CM

## 2019-08-12 LAB — COMPREHENSIVE METABOLIC PANEL
ALT: 19 U/L (ref 0–44)
AST: 28 U/L (ref 15–41)
Albumin: 4.1 g/dL (ref 3.5–5.0)
Alkaline Phosphatase: 62 U/L (ref 38–126)
Anion gap: 12 (ref 5–15)
BUN: 11 mg/dL (ref 6–20)
CO2: 25 mmol/L (ref 22–32)
Calcium: 9.5 mg/dL (ref 8.9–10.3)
Chloride: 104 mmol/L (ref 98–111)
Creatinine, Ser: 1.12 mg/dL (ref 0.61–1.24)
GFR calc Af Amer: >60 mL/min (ref >60)
GFR calc non Af Amer: >60 mL/min (ref >60)
Glucose, Bld: 80 mg/dL (ref 70–99)
Potassium: 3.9 mmol/L (ref 3.5–5.1)
Sodium: 141 mmol/L (ref 135–145)
Total Bilirubin: 1 mg/dL (ref 0.3–1.2)
Total Protein: 8.5 g/dL — ABNORMAL HIGH (ref 6.5–8.1)

## 2019-08-12 LAB — CBC WITH DIFFERENTIAL/PLATELET
Abs Immature Granulocytes: 0.05 10*3/uL (ref 0.00–0.07)
Basophils Absolute: 0 10*3/uL (ref 0.0–0.1)
Basophils Relative: 0 %
Eosinophils Absolute: 0.1 10*3/uL (ref 0.0–0.5)
Eosinophils Relative: 1 %
HCT: 41.5 % (ref 39.0–52.0)
Hemoglobin: 13.4 g/dL (ref 13.0–17.0)
Immature Granulocytes: 1 %
Lymphocytes Relative: 16 %
Lymphs Abs: 1.5 10*3/uL (ref 0.7–4.0)
MCH: 33 pg (ref 26.0–34.0)
MCHC: 32.3 g/dL (ref 30.0–36.0)
MCV: 102.2 fL — ABNORMAL HIGH (ref 80.0–100.0)
Monocytes Absolute: 1 10*3/uL (ref 0.1–1.0)
Monocytes Relative: 11 %
Neutro Abs: 6.8 10*3/uL (ref 1.7–7.7)
Neutrophils Relative %: 71 %
Platelets: 250 10*3/uL (ref 150–400)
RBC: 4.06 MIL/uL — ABNORMAL LOW (ref 4.22–5.81)
RDW: 13.2 % (ref 11.5–15.5)
WBC: 9.4 10*3/uL (ref 4.0–10.5)
nRBC: 0 % (ref 0.0–0.2)

## 2019-08-12 LAB — APTT: aPTT: 30 seconds (ref 24–36)

## 2019-08-12 LAB — TYPE AND SCREEN
ABO/RH(D): O NEG
Antibody Screen: NEGATIVE

## 2019-08-12 LAB — PROTIME-INR
INR: 0.9 (ref 0.8–1.2)
Prothrombin Time: 12 seconds (ref 11.4–15.2)

## 2019-08-12 MED ORDER — TRANEXAMIC ACID-NACL 1000-0.7 MG/100ML-% IV SOLN
1000.0000 mg | INTRAVENOUS | Status: AC
  Administered 2019-08-13: 1000 mg via INTRAVENOUS
  Filled 2019-08-12: qty 100

## 2019-08-12 NOTE — ED Provider Notes (Signed)
[facility] Emergency Department Provider Note  ____________________________________________   First MD Initiated Contact with Patient 08/12/19 2303     (approximate)  I have reviewed the triage vital signs and the nursing notes.   HISTORY  Chief Complaint Sore Throat    HPI Tyler Holloway is a 59 y.o. 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 Janese Banks who also reports ongoing heavy alcohol use and the use of cocaine.  He presents tonight for evaluation of worsening sore throat over the last 24 to 48 hours and the development over the last 3 hours of spitting or coughing up blood.  He said he has had pain since the radiation treatment which is been a few months, although he is not currently undergoing any treatment, neither radiation nor chemotherapy.  However the pain has intensified recently.  He does not typically have any hemoptysis but over the last 3 hours has been coughing and spitting up blood although he is uncertain exactly of the source of the blood.  The symptoms are moderate but concerning to him.  Nothing in particular makes it better or worse.  He is breathing comfortably and able to swallow.  He reports no increased swelling over baseline.  He denies fever, nausea, vomiting, shortness of breath, chest pain, and abdominal pain.  He said he is taking no medications at this time.  He admits to "a couple of drinks and beers" earlier today and smoking crack earlier today as well.  He is unaware of any history of esophageal varices.  He last saw his oncologist about 2 weeks ago for a clinic follow-up.        Past Medical History:  Diagnosis Date  . Hypertension     Patient Active Problem List   Diagnosis Date Noted  . Multifocal pneumonia 08/13/2019  . Goals of care, counseling/discussion 03/16/2019  . 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 HBV (hepatitis B virus) infection 11/04/2015  . Hepatitis B infection without delta agent without hepatic coma 09/11/2015  . HCV antibody positive 04/26/2015  . Peripheral neuropathy 02/14/2015  . Benign prostatic hyperplasia 02/14/2015    Past Surgical History:  Procedure Laterality Date  . LARYNGOSCOPY Bilateral 03/02/2019   Procedure: DIRECT LARYNGOSCOPY W/ BX.;  Surgeon: Margaretha Sheffield, MD;  Location: [place];  Service: ENT;  Laterality: Bilateral;  . NO PAST SURGERIES    . RIGID ESOPHAGOSCOPY Bilateral 03/02/2019   Procedure: RIGID ESOPHAGOSCOPY RIGID BRONCOSCOPY;  Surgeon: Margaretha Sheffield, MD;  Location: [place];  Service: ENT;  Laterality: Bilateral;    Prior to Admission medications   Medication Sig Start Date End Date Taking? Authorizing Provider  dexamethasone (DECADRON) 2 MG tablet Take 1 tablet (2 mg total) by mouth 2 (two) times daily. Patient not taking: Reported on 07/17/2019 06/21/19   Noreene Filbert, MD  pantoprazole (PROTONIX) 20 MG tablet Take 1 tablet (20 mg total) by mouth daily. Patient not taking: Reported on 08/13/2019 07/17/19   Sindy Guadeloupe, MD  sucralfate (CARAFATE) 1 GM/10ML suspension Take 10 mLs (1 g total) by mouth 4 (four) times daily -  with meals and at bedtime. Patient not taking: Reported on 07/17/2019 07/06/19   Borders, Kirt Boys, NP    Allergies Pollen extract  Family History  Problem Relation Age of Onset  . Diabetes Mellitus I Mother   . Pancreatic cancer Mother   .  Stroke Mother   . Heart Problems Sister   . Thyroid disease Sister   . Hypertension Sister   . Cervical cancer Sister     Social History Social History   Tobacco Use  . Smoking status: Current Every Day Smoker    Packs/day: 0.25    Years: 45.00    Pack years: 11.25    Types: Cigarettes  . Smokeless tobacco: Current User  . Tobacco comment: 10 cig a day  Vaping Use  . Vaping Use: Never used  Substance Use Topics  . Alcohol use: Yes     Alcohol/week: 7.0 standard drinks    Types: 7 Cans of beer per week    Comment: 1/2 pint a day of liquor  . Drug use: Not Currently    Review of Systems Constitutional: No fever/chills Eyes: No visual changes. ENT: +sore throat. Cardiovascular: Denies chest pain. Respiratory: Hemoptysis, acute onset within the last few hours.  Denies shortness of breath. Gastrointestinal: No abdominal pain.  No nausea, no vomiting.  No diarrhea.  No constipation. Genitourinary: Negative for dysuria. Musculoskeletal: Negative for neck pain.  Negative for back pain. Integumentary: Negative for rash. Neurological: Negative for headaches, focal weakness or numbness. Psych: Frequent drug and alcohol use  ____________________________________________   PHYSICAL EXAM:  VITAL SIGNS: ED Triage Vitals  Enc Vitals Group     BP 08/12/19 2233 (!) 141/84     Pulse Rate 08/12/19 2233 87     Resp 08/12/19 2233 (!) 22     Temp 08/12/19 2233 98.4 F (36.9 C)     Temp Source 08/12/19 2233 Oral     SpO2 08/12/19 2233 100 %     Weight 08/12/19 2231 65.8 kg (145 lb)     Height 08/12/19 2231 1.905 m (6\' 3" )     Head Circumference --      Peak Flow --      Pain Score --      Pain Loc --      Pain Edu? --      Excl. in [place]? --     Constitutional: Alert and oriented.  Eyes: Conjunctivae are normal.  Head: Atraumatic. Nose: No congestion/rhinnorhea. Mouth/Throat: Poor dentition.  Submandibular induration on the left that is apparently chronic.  Minimal tenderness to palpation.  Pharyngeal exam in general demonstrates no visible obstructions or airway compromise. Neck: No stridor.  No meningeal signs.   Cardiovascular: Normal rate, regular rhythm. Good peripheral circulation. Grossly normal heart sounds. Respiratory: Normal respiratory effort.  No retractions.  Patient occasionally will cough and spit out bright red blood. Gastrointestinal: Soft and nontender. No distention.  Musculoskeletal: No lower  extremity tenderness nor edema. No gross deformities of extremities. Neurologic:  Normal speech and language. No gross focal neurologic deficits are appreciated.  Skin:  Skin is warm, dry and intact. Psychiatric: Mood and affect are normal. Speech and behavior are normal.  ____________________________________________   LABS (all labs ordered are listed, but only abnormal results are displayed)  Labs Reviewed  CBC WITH DIFFERENTIAL/PLATELET - Abnormal; Notable for the following components:      Result Value   RBC 4.06 (*)    MCV 102.2 (*)    All other components within normal limits  COMPREHENSIVE METABOLIC PANEL - Abnormal; Notable for the following components:   Total Protein 8.5 (*)    All other components within normal limits  SARS CORONAVIRUS 2 BY RT PCR (HOSPITAL ORDER, [place] [facility])  EXPECTORATED SPUTUM ASSESSMENT W REFEX  TO RESP CULTURE  PROTIME-INR  APTT  ETHANOL  URINE DRUG SCREEN, QUALITATIVE ([facility] ONLY)  HIV ANTIBODY (ROUTINE TESTING W REFLEX)  BASIC METABOLIC PANEL  CBC  LEGIONELLA PNEUMOPHILA SEROGP 1 UR AG  STREP PNEUMONIAE URINARY ANTIGEN  TYPE AND SCREEN   ____________________________________________  EKG  No indication for emergent EKG ____________________________________________  RADIOLOGY Ursula Alert, personally viewed and evaluated these images (plain radiographs) as part of my medical decision making, as well as reviewing the written report by the radiologist.  ED MD interpretation: CT chest demonstrates probable multifocal pneumonia, questionable aspiration.  CT soft tissue neck read demonstrates the throat cancer although it improved from prior.  He has some asymmetric edema on the left as well as possible ulceration.  Official radiology report(s): CT Soft Tissue Neck W Contrast  Result Date: 08/13/2019 CLINICAL DATA:  Initial evaluation for hemoptysis. History of head neck cancer, status post XRT. EXAM: CT NECK WITH  CONTRAST TECHNIQUE: Multidetector CT imaging of the neck was performed using the standard protocol following the bolus administration of intravenous contrast. CONTRAST:  152mL OMNIPAQUE IOHEXOL 300 MG/ML  SOLN COMPARISON:  Prior study from 02/21/2019. FINDINGS: Pharynx and larynx: Oral cavity within normal limits. No acute abnormality about the remaining dentition. No base of tongue lesion. Oropharynx and nasopharynx within normal limits. Layering secretions noted within the hypopharynx. 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 The left aryepiglottic fold is asymmetrically enlarged and edematous, with infiltration of the adjacent left paraglottic fat. Retained secretions extends into the left greater than right piriform sinuses, with possible ulceration on the left (series 3, images 70, 73). Asymmetric sclerosis involving the left arytenoid cartilage noted. The left true cord is edematous and medialized towards the midline. Glottis is largely effaced on this exam. Asymmetric sclerosis also noted within the left thyroid cartilage without osseous erosion. Mild induration within the adjacent left parapharyngeal soft tissues. Preponderance of these changes favored to reflect post radiation changes. No definite discrete mass evident by CT. The subglottic airway remains clear. Salivary glands: Salivary glands including the parotid and submandibular glands are within normal limits. Thyroid: Thyroid remains within normal limits. Lymph nodes: No new enlarged or pathologic appearing adenopathy identified within the neck. Vascular: Normal intravascular enhancement seen throughout the neck. Moderate atherosclerotic change present about the aortic arch, carotid bifurcations, and visualized carotid siphons. Limited intracranial: Unremarkable. Visualized orbits: Visualized globes and  orbital soft tissues within normal limits. Mastoids and visualized paranasal sinuses: Mild mucosal thickening noted within the maxillary sinuses bilaterally. Visualized paranasal sinuses are otherwise clear. Visualize mastoids and middle ear cavities are well pneumatized and free of fluid. Skeleton: No acute osseous abnormality. No new discrete or worrisome osseous lesions. Probable small benign bone island noted within the T3 vertebral body, stable. Moderate multilevel cervical spondylosis, most pronounced at C5-6 and C6-7. Upper chest: Better evaluated on concomitant CT of the chest. Emphysematous changes again noted. Other: None. IMPRESSION: 1. 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 Asymmetric edema seen involving the left aryepiglottic fold and adjacent left para epiglottic fat without definite discrete or identifiable mass by CT. Retained secretions present within the hypopharynx extending into the left greater than right piriform sinuses. A degree of superimposed ulceration is suspected on the left. 2.  No new adenopathy within the neck. 3. Aortic Atherosclerosis (ICD10-I70.0) and Emphysema (ICD10-J43.9). Electronically Signed   By: Jeannine Boga M.D.   On: 08/13/2019 02:08   CT Chest W Contrast  Result Date: 08/13/2019 CLINICAL DATA:  59 year old male with hemoptysis. History of head neck cancer with sore throat since radiation approximately a month ago. EXAM: CT CHEST WITH CONTRAST TECHNIQUE: Multidetector CT imaging of the chest was performed during intravenous contrast administration. CONTRAST:  13mL OMNIPAQUE IOHEXOL 300 MG/ML  SOLN COMPARISON:  None. FINDINGS: Cardiovascular: There is no cardiomegaly or pericardial effusion. Advanced 3 vessel coronary vascular calcification. Moderate atherosclerotic calcification of the thoracic  aorta. No aneurysmal dilatation or dissection. The origins of the great vessels of the aortic arch appear patent as visualized. The central pulmonary arteries appear unremarkable for the degree of opacification. Mediastinum/Nodes: There is no hilar or mediastinal adenopathy. Small right hilar lymph node measures approximately 6 mm in short axis. The esophagus and the thyroid gland are grossly unremarkable. No mediastinal fluid collection. Lungs/Pleura: Patchy area of nodular density with tree-in-bud appearance in the medial basal left lower lobe as well as nodular density in the right middle lobe most consistent with pneumonia. Aspiration is not excluded. Clinical correlation is recommended. No lobar consolidation, pleural effusion, or pneumothorax. The central airways are patent. Upper Abdomen: Probable fatty liver. Indeterminate ill-defined hypodense area in the left lobe of the liver, likely artifactual. Musculoskeletal: No chest wall abnormality. No acute or significant osseous findings. Partially visualized soft tissue thickening in the lower neck in keeping with history of radiation. This is better evaluated on the neck CT. IMPRESSION: 1. Multifocal pneumonia. Aspiration is not excluded. Clinical correlation is recommended. 2. Partially visualized soft tissue thickening in the lower neck in keeping with history of radiation. This is better evaluated on the neck CT. 3. Aortic Atherosclerosis (ICD10-I70.0). Electronically Signed   By: Anner Crete M.D.   On: 08/13/2019 00:50    ____________________________________________   PROCEDURES   Procedure(s) performed (including Critical Care):  Procedures   ____________________________________________   INITIAL IMPRESSION / MDM / [place] / ED COURSE  As part of my medical decision making, I reviewed the following data within the [place] notes reviewed and incorporated, Labs reviewed , Old chart reviewed,  Discussed with oncologist (Dr. Tasia Catchings), Discussed with admitting physician (Dr. Sidney Ace) and Notes from prior ED visits   Differential diagnosis includes, but is not limited to, new onset hemoptysis of any 1 of a number of causes, bleeding neoplasm of the throat, fistula, esophageal varices.  The patient is on the cardiac monitor to evaluate for evidence of arrhythmia and/or significant heart rate changes.  I am very concerned about the patient given his comorbidities including known history of squamous cell carcinoma with a supraglottic mass and decreased mobility of his left vocal cord.  He last had a laryngoscopy about 5 months ago and he was last intubated at that time and apparently at the time it was not a particularly difficult intubation, but it is difficult to know what will be the degree of obstruction at this time.  I verified with the patient that he wishes to be full code.  He is not in any particular distress currently although he is occasionally spitting out bright red blood.  Given his high risk of airway compromise due to what appears to be hemoptysis but could also be some degree of hematemesis, I am providing TXA 1 g IV for acute bleeding.  I  have added on an ethanol level since the patient reports he has recently been drinking and I have added on a urine drug screen for Anesthesia to assist with future preoperative planning if it becomes necessary, the patient freely admits to the use of both cocaine and marijuana.  His coagulation studies are pending.  CBC and comprehensive metabolic panel are both within normal limits.  Vital signs are stable at this time and he is not tachycardic.  I have ordered a CT soft tissue neck and chest both with IV contrast for further evaluation of his hemoptysis and to assess changes of the supraglottic mass.  Patient understands and agrees with the plan.     Clinical Course as of Aug 12 309  Sun Aug 13, 2019  0208 The CT chest suggest multifocal  pneumonia, possibly aspiration, which could be consistent given his history of dysphagia although he is not having any significant respiratory issues other than the hemoptysis.  CT soft tissue neck has not yet been interpreted; I called and spoke with Dr. Sherrye Payor who confirmed that there was not yet in interpretation of the imaging and he is looking into it.   [CF]  0221 Non-specific changes on CT neck, possible ulceration.  Will discuss with Dr. Tasia Catchings with medical oncology, anticipate admission   [CF]  0227 I discussed the case by phone with Dr. Tasia Catchings.  We discussed the case in detail and she agreed with the plan for admission and consult for laryngoscopy versus bronchoscopy as per specialist preference.  Given the appearance of multifocal pneumonia on the chest CT, even though it is not correlating clinically for pneumonia, she recommended broad-spectrum antibiotic treatment given the possibility of aspiration in the setting of dysphagia as well as the possible ulceration of his airway.  Given his chronic issues which include alcoholism and cancer treatment, I will treat with ceftriaxone 1 g IV and clindamycin 600 mg IV.  Consulting hospitalist for admission and updating patient.   [CF]  4332 Discussed with Dr. Sidney Ace who will admit.   [CF]    Clinical Course User Index [CF] Hinda Kehr, MD     ____________________________________________  FINAL CLINICAL IMPRESSION(S) / ED DIAGNOSES  Final diagnoses:  Hemoptysis  Throat cancer ([place])  Multifocal pneumonia     MEDICATIONS GIVEN DURING THIS VISIT:  Medications  cefTRIAXone (ROCEPHIN) 1 g in sodium chloride 0.9 % 100 mL IVPB (1 g Intravenous New Bag/Given 08/13/19 0252)  clindamycin (CLEOCIN) IVPB 600 mg (has no administration in time range)  pantoprazole (PROTONIX) injection 40 mg (has no administration in time range)  Ampicillin-Sulbactam (UNASYN) 3 g in sodium chloride 0.9 % 100 mL IVPB (has no administration in time range)  enoxaparin  (LOVENOX) injection 40 mg (has no administration in time range)  0.9 %  sodium chloride infusion (has no administration in time range)  acetaminophen (TYLENOL) tablet 650 mg (has no administration in time range)    Or  acetaminophen (TYLENOL) suppository 650 mg (has no administration in time range)  traZODone (DESYREL) tablet 25 mg (has no administration in time range)  ondansetron (ZOFRAN) tablet 4 mg (has no administration in time range)    Or  ondansetron (ZOFRAN) injection 4 mg (has no administration in time range)  azithromycin (ZITHROMAX) 500 mg in sodium chloride 0.9 % 250 mL IVPB (has no administration in time range)  guaiFENesin (MUCINEX) 12 hr tablet 600 mg (has no administration in time range)  ipratropium-albuterol (DUONEB) 0.5-2.5 (3) MG/3ML nebulizer solution 3 mL (has no  administration in time range)  ipratropium-albuterol (DUONEB) 0.5-2.5 (3) MG/3ML nebulizer solution 3 mL (has no administration in time range)  tranexamic acid (CYKLOKAPRON) IVPB 1,000 mg (0 mg Intravenous Stopped 08/13/19 0101)  iohexol (OMNIPAQUE) 300 MG/ML solution 100 mL (100 mLs Intravenous Contrast Given 08/13/19 0016)  0.9 %  sodium chloride infusion ( Intravenous New Bag/Given 08/13/19 0251)     ED Discharge Orders    None      *Please note:  Tyler Holloway was evaluated in Emergency Department on 08/13/2019 for the symptoms described in the history of present illness. He was evaluated in the context of the global COVID-19 pandemic, which necessitated consideration that the patient might be at risk for infection with the SARS-CoV-2 virus that causes COVID-19. Institutional protocols and algorithms that pertain to the evaluation of patients at risk for COVID-19 are in a state of rapid change based on information released by regulatory bodies including the CDC and federal and state organizations. These policies and algorithms were followed during the patient's care in the ED.  Some ED evaluations and  interventions may be delayed as a result of limited staffing during and after the pandemic.*  Note:  This document was prepared using Dragon voice recognition software and may include unintentional dictation errors.   Hinda Kehr, MD 08/13/19 [phone]

## 2019-08-12 NOTE — ED Triage Notes (Signed)
Patient reports his throat has been sore since having radiation approximately a month ago for throat cancer.  Patient also reports he is spitting up blood especially after coughing.  Patient appears to be in pain.

## 2019-08-12 NOTE — ED Notes (Signed)
Patient seen walking outside in no acute distress at this time.

## 2019-08-13 ENCOUNTER — Emergency Department: Payer: BC Managed Care – PPO

## 2019-08-13 ENCOUNTER — Encounter: Payer: Self-pay | Admitting: Radiology

## 2019-08-13 ENCOUNTER — Emergency Department
Admission: EM | Admit: 2019-08-13 | Discharge: 2019-08-13 | Disposition: A | Discharge status: Home / Self Care | Payer: BC Managed Care – PPO | Attending: Emergency Medicine | Admitting: Emergency Medicine

## 2019-08-13 ENCOUNTER — Other Ambulatory Visit: Payer: Self-pay

## 2019-08-13 DIAGNOSIS — I700 Atherosclerosis of aorta: Secondary | ICD-10-CM | POA: Diagnosis present

## 2019-08-13 DIAGNOSIS — Z5321 Procedure and treatment not carried out due to patient leaving prior to being seen by health care provider: Secondary | ICD-10-CM | POA: Diagnosis not present

## 2019-08-13 DIAGNOSIS — Z8049 Family history of malignant neoplasm of other genital organs: Secondary | ICD-10-CM | POA: Diagnosis not present

## 2019-08-13 DIAGNOSIS — F1410 Cocaine abuse, uncomplicated: Secondary | ICD-10-CM | POA: Diagnosis present

## 2019-08-13 DIAGNOSIS — I10 Essential (primary) hypertension: Secondary | ICD-10-CM | POA: Diagnosis present

## 2019-08-13 DIAGNOSIS — J189 Pneumonia, unspecified organism: Secondary | ICD-10-CM | POA: Diagnosis not present

## 2019-08-13 DIAGNOSIS — J387 Other diseases of larynx: Secondary | ICD-10-CM

## 2019-08-13 DIAGNOSIS — K219 Gastro-esophageal reflux disease without esophagitis: Secondary | ICD-10-CM

## 2019-08-13 DIAGNOSIS — R042 Hemoptysis: Secondary | ICD-10-CM

## 2019-08-13 DIAGNOSIS — C140 Malignant neoplasm of pharynx, unspecified: Secondary | ICD-10-CM | POA: Diagnosis present

## 2019-08-13 DIAGNOSIS — K920 Hematemesis: Secondary | ICD-10-CM | POA: Insufficient documentation

## 2019-08-13 DIAGNOSIS — Z823 Family history of stroke: Secondary | ICD-10-CM | POA: Diagnosis not present

## 2019-08-13 DIAGNOSIS — Z5329 Procedure and treatment not carried out because of patient's decision for other reasons: Secondary | ICD-10-CM | POA: Diagnosis present

## 2019-08-13 DIAGNOSIS — Z833 Family history of diabetes mellitus: Secondary | ICD-10-CM | POA: Diagnosis not present

## 2019-08-13 DIAGNOSIS — J690 Pneumonitis due to inhalation of food and vomit: Secondary | ICD-10-CM | POA: Diagnosis present

## 2019-08-13 DIAGNOSIS — J029 Acute pharyngitis, unspecified: Secondary | ICD-10-CM | POA: Diagnosis present

## 2019-08-13 DIAGNOSIS — G629 Polyneuropathy, unspecified: Secondary | ICD-10-CM | POA: Diagnosis present

## 2019-08-13 DIAGNOSIS — Z20822 Contact with and (suspected) exposure to covid-19: Secondary | ICD-10-CM | POA: Diagnosis present

## 2019-08-13 DIAGNOSIS — Z800 Family history of malignant neoplasm of digestive organs: Secondary | ICD-10-CM | POA: Diagnosis not present

## 2019-08-13 DIAGNOSIS — B1910 Unspecified viral hepatitis B without hepatic coma: Secondary | ICD-10-CM | POA: Diagnosis present

## 2019-08-13 DIAGNOSIS — Z8349 Family history of other endocrine, nutritional and metabolic diseases: Secondary | ICD-10-CM | POA: Diagnosis not present

## 2019-08-13 DIAGNOSIS — Z923 Personal history of irradiation: Secondary | ICD-10-CM | POA: Diagnosis not present

## 2019-08-13 DIAGNOSIS — F17210 Nicotine dependence, cigarettes, uncomplicated: Secondary | ICD-10-CM | POA: Diagnosis present

## 2019-08-13 DIAGNOSIS — J439 Emphysema, unspecified: Secondary | ICD-10-CM | POA: Diagnosis present

## 2019-08-13 DIAGNOSIS — N400 Enlarged prostate without lower urinary tract symptoms: Secondary | ICD-10-CM | POA: Diagnosis present

## 2019-08-13 DIAGNOSIS — Z8249 Family history of ischemic heart disease and other diseases of the circulatory system: Secondary | ICD-10-CM | POA: Diagnosis not present

## 2019-08-13 DIAGNOSIS — B1920 Unspecified viral hepatitis C without hepatic coma: Secondary | ICD-10-CM | POA: Diagnosis present

## 2019-08-13 LAB — SARS CORONAVIRUS 2 BY RT PCR (HOSPITAL ORDER, PERFORMED IN ~~LOC~~ HOSPITAL LAB): SARS Coronavirus 2: NEGATIVE

## 2019-08-13 MED ORDER — SODIUM CHLORIDE 0.9 % IV SOLN
1.0000 g | INTRAVENOUS | Status: AC
  Administered 2019-08-13: 1 g via INTRAVENOUS
  Filled 2019-08-13: qty 10

## 2019-08-13 MED ORDER — IPRATROPIUM-ALBUTEROL 0.5-2.5 (3) MG/3ML IN SOLN: 3.0000 mL | Freq: Four times a day (QID) | RESPIRATORY_TRACT | Status: DC

## 2019-08-13 MED ORDER — ACETAMINOPHEN 650 MG RE SUPP: 650.0000 mg | Freq: Four times a day (QID) | RECTAL | Status: DC | PRN

## 2019-08-13 MED ORDER — SODIUM CHLORIDE 0.9 % IV SOLN: Freq: Once | INTRAVENOUS | Status: AC

## 2019-08-13 MED ORDER — ONDANSETRON HCL 4 MG/2ML IJ SOLN: 4.0000 mg | Freq: Four times a day (QID) | INTRAMUSCULAR | Status: DC | PRN

## 2019-08-13 MED ORDER — IOHEXOL 300 MG/ML  SOLN
100.0000 mL | Freq: Once | INTRAMUSCULAR | Status: AC | PRN
  Administered 2019-08-13: 100 mL via INTRAVENOUS

## 2019-08-13 MED ORDER — SODIUM CHLORIDE 0.9 % IV SOLN: 3.0000 g | Freq: Four times a day (QID) | INTRAVENOUS | Status: DC

## 2019-08-13 MED ORDER — SODIUM CHLORIDE 0.9 % IV SOLN: INTRAVENOUS | Status: DC

## 2019-08-13 MED ORDER — CLINDAMYCIN PHOSPHATE 600 MG/50ML IV SOLN
600.0000 mg | Freq: Once | INTRAVENOUS | Status: AC
  Administered 2019-08-13: 600 mg via INTRAVENOUS
  Filled 2019-08-13: qty 50

## 2019-08-13 MED ORDER — MORPHINE SULFATE (PF) 2 MG/ML IV SOLN: 1.0000 mg | INTRAVENOUS | Status: DC | PRN

## 2019-08-13 MED ORDER — PANTOPRAZOLE SODIUM 40 MG IV SOLR: 40.0000 mg | Freq: Two times a day (BID) | INTRAVENOUS | Status: DC

## 2019-08-13 MED ORDER — GUAIFENESIN ER 600 MG PO TB12: 600.0000 mg | ORAL_TABLET | Freq: Two times a day (BID) | ORAL | Status: DC

## 2019-08-13 MED ORDER — ENOXAPARIN SODIUM 40 MG/0.4ML ~~LOC~~ SOLN: 40.0000 mg | SUBCUTANEOUS | Status: DC

## 2019-08-13 MED ORDER — TRAMADOL HCL 50 MG PO TABS: 50.0000 mg | ORAL_TABLET | Freq: Four times a day (QID) | ORAL | Status: DC | PRN

## 2019-08-13 MED ORDER — IPRATROPIUM-ALBUTEROL 0.5-2.5 (3) MG/3ML IN SOLN: 3.0000 mL | RESPIRATORY_TRACT | Status: DC | PRN

## 2019-08-13 MED ORDER — SODIUM CHLORIDE 0.9 % IV SOLN
500.0000 mg | INTRAVENOUS | Status: DC
  Administered 2019-08-13: 500 mg via INTRAVENOUS
  Filled 2019-08-13: qty 500

## 2019-08-13 MED ORDER — TRAZODONE HCL 50 MG PO TABS: 25.0000 mg | ORAL_TABLET | Freq: Every evening | ORAL | Status: DC | PRN

## 2019-08-13 MED ORDER — ACETAMINOPHEN 325 MG PO TABS: 650.0000 mg | ORAL_TABLET | Freq: Four times a day (QID) | ORAL | Status: DC | PRN

## 2019-08-13 MED ORDER — ONDANSETRON HCL 4 MG PO TABS: 4.0000 mg | ORAL_TABLET | Freq: Four times a day (QID) | ORAL | Status: DC | PRN

## 2019-08-13 MED ORDER — SODIUM CHLORIDE 0.9 % IV SOLN
3.0000 g | Freq: Four times a day (QID) | INTRAVENOUS | Status: DC
  Administered 2019-08-13: 3 g via INTRAVENOUS
  Filled 2019-08-13: qty 8

## 2019-08-13 NOTE — ED Notes (Signed)
Patient returned to room. 

## 2019-08-13 NOTE — ED Notes (Signed)
Pt informed registration that he is leaving and will come back later.

## 2019-08-13 NOTE — ED Notes (Addendum)
PT disconnected monitors to go to bathroom, broke IV line, site dry and intact.  Pt states to this RN that he has to leave to go home and lock up his home.  Explained to pt the dangers of leaving the hospital at this time, pt states that he understands and wants to leave.  Pt states that he "will come right back", explained to pt that he will not be able to come back to where he left off and will be required to start over.  PT verbalized understanding of same.  Informed pt that I need to take with MD, he states "go ahead and talk with them, but I gotta go."  Pt refuses to wait for RN to talk with MD, pt signed AMA form, IV removed and pt left ambulatory at this time.  MD made aware of same.

## 2019-08-13 NOTE — ED Notes (Signed)
Patient refusing to keep monitoring equipment on at this time.  Explained need for monitoring but patient declines at this time.

## 2019-08-13 NOTE — Discharge Summary (Signed)
Physician Discharge Summary  Tyler Holloway DOB: Nov 15, 1960 DOA: 08/12/2019  PCP: Patient, No Pcp Per  Admit date: 08/12/2019 Discharge date: 08/13/2019  Admitted From: home  Disposition:  Pt left AMA  Recommendations for Outpatient Follow-up:  1. Pt left AMA  Home Health: Equipment/Devices:  Discharge Condition: guarded  CODE STATUS: Diet recommendation: regular   Brief/Interim Summary: HPI was taken from Dr. Sidney Ace: Tyler Holloway  is a 59 y.o. African-American male with a known history of hypertension, throat cancer status post radiotherapy, hepatitis B and C, peripheral neuropathy, alcohol and cocaine abuse and BPH, who presented to the emergency room with acute onset of worsening sore throat for the last 3 days with associated spitting blood and likely hemoptysis.  He has had pain since her radiation therapy which has been a few months.  He is currently not undergoing any therapy for it.  He denied any fever or chills or nausea or vomiting or abdominal pain or diarrhea.  No chest pain or palpitations.  No significant dyspnea or cough or wheezing.  He had a couple of beers earlier today and did smoke crack cocaine.  No known history of esophageal varices.  Upon presentation to the emergency room, heart rate was 104 and otherwise vital signs were within normal.  Later blood pressure was 162/114 with a heart rate of 97.  Labs revealed unremarkable CMP and CBC are unremarkable.  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  There was asymmetric edema seen involving the left aryepiglottic fold and adjacent left partial epiglottic fat without definite discrete or identifiable mass by CT.  Showed retained secretions the patient had retained secretions in the hypopharynx extending into the left greater than  right pyriform sinuses with a degree of superimposed ulceration suspected on the left.  No new adenopathy within the neck.  It also showed aortic  atherosclerosis and emphysema.  He was given IV Rocephin and clindamycin as well as Cyklokapron 1 g IV and 125 mL IV normal saline per hour.  He will admitted to a progressive unit bed for further evaluation and management.   From Dr. Lenise Herald: Pt left AMA before I had a chance to see the pt .   Discharge Diagnoses:  Active Problems:   Multifocal pneumonia Multifocal pneumonia: likely aspiration. Continue on IV unasyn, azithromycin. Continue on bronchodilators. Encourage incentive spirometry   Hemoptysis: etiology unclear, possible secondary to ulceration on the left as per CT. S/p cyklokapron given in the ER. Hx of squamous cell carcinoma. ENT consulted   Throat squamous cell carcinoma: morphine, tramadol prn for pain. Onco consulted   GERD: continue on PPI    Discharge Instructions   Allergies as of 08/13/2019      Reactions   Pollen Extract Other (See Comments)   sneezing      Medication List    ASK your doctor about these medications   dexamethasone 2 MG tablet Commonly known as: DECADRON Take 1 tablet (2 mg total) by mouth 2 (two) times daily.   pantoprazole 20 MG tablet Commonly known as: Protonix Take 1 tablet (20 mg total) by mouth daily.   sucralfate 1 GM/10ML suspension Commonly known as: Carafate Take 10 mLs (1 g total) by mouth 4 (four) times daily -  with meals and at bedtime.       Allergies  Allergen  Reactions  . Pollen Extract Other (See Comments)    sneezing    Consultations:  ENT   Procedures/Studies: CT Soft Tissue Neck W Contrast  Result Date: 08/13/2019 CLINICAL DATA:  Initial evaluation for hemoptysis. History of head neck cancer, status post XRT. EXAM: CT NECK WITH CONTRAST TECHNIQUE: Multidetector CT imaging of the neck was performed using the standard protocol following the bolus  administration of intravenous contrast. CONTRAST:  118mL OMNIPAQUE IOHEXOL 300 MG/ML  SOLN COMPARISON:  Prior study from 02/21/2019. FINDINGS: Pharynx and larynx: Oral cavity within normal limits. No acute abnormality about the remaining dentition. No base of tongue lesion. Oropharynx and nasopharynx within normal limits. Layering secretions noted within the hypopharynx. 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 The left aryepiglottic fold is asymmetrically enlarged and edematous, with infiltration of the adjacent left paraglottic fat. Retained secretions extends into the left greater than right piriform sinuses, with possible ulceration on the left (series 3, images 70, 73). Asymmetric sclerosis involving the left arytenoid cartilage noted. The left true cord is edematous and medialized towards the midline. Glottis is largely effaced on this exam. Asymmetric sclerosis also noted within the left thyroid cartilage without osseous erosion. Mild induration within the adjacent left parapharyngeal soft tissues. Preponderance of these changes favored to reflect post radiation changes. No definite discrete mass evident by CT. The subglottic airway remains clear. Salivary glands: Salivary glands including the parotid and submandibular glands are within normal limits. Thyroid: Thyroid remains within normal limits. Lymph nodes: No new enlarged or pathologic appearing adenopathy identified within the neck. Vascular: Normal intravascular enhancement seen throughout the neck. Moderate atherosclerotic change present about the aortic arch, carotid bifurcations, and visualized carotid siphons. Limited intracranial: Unremarkable. Visualized orbits: Visualized globes and orbital soft tissues within normal limits. Mastoids and visualized paranasal sinuses: Mild mucosal thickening noted within  the maxillary sinuses bilaterally. Visualized paranasal sinuses are otherwise clear. Visualize mastoids and middle ear cavities are well pneumatized and free of fluid. Skeleton: No acute osseous abnormality. No new discrete or worrisome osseous lesions. Probable small benign bone island noted within the T3 vertebral body, stable. Moderate multilevel cervical spondylosis, most pronounced at C5-6 and C6-7. Upper chest: Better evaluated on concomitant CT of the chest. Emphysematous changes again noted. Other: None. IMPRESSION: 1. 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 Asymmetric edema seen involving the left aryepiglottic fold and adjacent left para epiglottic fat without definite discrete or identifiable mass by CT. Retained secretions present within the hypopharynx extending into the left greater than right piriform sinuses. A degree of superimposed ulceration is suspected on the left. 2. No new adenopathy within the neck. 3. Aortic Atherosclerosis (ICD10-I70.0) and Emphysema (ICD10-J43.9). Electronically Signed   By: Jeannine Boga M.D.   On: 08/13/2019 02:08   CT Chest W Contrast  Result Date: 08/13/2019 CLINICAL DATA:  59 year old male with hemoptysis. History of head neck cancer with sore throat since radiation approximately a month ago. EXAM: CT CHEST WITH CONTRAST TECHNIQUE: Multidetector CT imaging of the chest was performed during intravenous contrast administration. CONTRAST:  124mL OMNIPAQUE IOHEXOL 300 MG/ML  SOLN COMPARISON:  None. FINDINGS: Cardiovascular: There is no cardiomegaly or pericardial effusion. Advanced 3 vessel coronary vascular calcification. Moderate atherosclerotic calcification of the thoracic aorta. No  aneurysmal dilatation or dissection. The origins of the great vessels of the aortic arch appear patent as visualized.  The central pulmonary arteries appear unremarkable for the degree of opacification. Mediastinum/Nodes: There is no hilar or mediastinal adenopathy. Small right hilar lymph node measures approximately 6 mm in short axis. The esophagus and the thyroid gland are grossly unremarkable. No mediastinal fluid collection. Lungs/Pleura: Patchy area of nodular density with tree-in-bud appearance in the medial basal left lower lobe as well as nodular density in the right middle lobe most consistent with pneumonia. Aspiration is not excluded. Clinical correlation is recommended. No lobar consolidation, pleural effusion, or pneumothorax. The central airways are patent. Upper Abdomen: Probable fatty liver. Indeterminate ill-defined hypodense area in the left lobe of the liver, likely artifactual. Musculoskeletal: No chest wall abnormality. No acute or significant osseous findings. Partially visualized soft tissue thickening in the lower neck in keeping with history of radiation. This is better evaluated on the neck CT. IMPRESSION: 1. Multifocal pneumonia. Aspiration is not excluded. Clinical correlation is recommended. 2. Partially visualized soft tissue thickening in the lower neck in keeping with history of radiation. This is better evaluated on the neck CT. 3. Aortic Atherosclerosis (ICD10-I70.0). Electronically Signed   By: Anner Crete M.D.   On: 08/13/2019 00:50   NM PET Image Restag (PS) Skull Base To Thigh  Result Date: 07/25/2019 CLINICAL DATA:  Subsequent treatment strategy for head neck carcinoma. Squamous cell carcinoma supraglottic larynx. Post radiation therapy. EXAM: NUCLEAR MEDICINE PET SKULL BASE TO THIGH TECHNIQUE: 7.5 mCi F-18 FDG was injected intravenously. Full-ring PET imaging was performed from the skull base to thigh after the radiotracer. CT data was obtained and used for attenuation correction and anatomic localization. Fasting blood glucose:  120 mg/dl COMPARISON:  PET-CT 03/21/2019 FINDINGS:  Mediastinal blood pool activity: SUV max 1.9 Liver activity: SUV max NA NECK: Resolution of the intense hypermetabolic tissue in the supraglottic space. There is however residual hypermetabolic activity associated with the LEFT larynx extending to the LEFT arytenoid cartilage with SUV max equal 6.1. There are no hypermetabolic lymph nodes in the LEFT or RIGHT neck. Incidental CT findings: none CHEST: No hypermetabolic mediastinal lymph nodes. No suspicious pulmonary nodules. Incidental CT findings: Coronary artery calcification and aortic atherosclerotic calcification. ABDOMEN/PELVIS: No abnormal hypermetabolic activity within the liver, pancreas, adrenal glands, or spleen. No hypermetabolic lymph nodes in the abdomen or pelvis. Incidental CT findings: Atherosclerotic calcification of the aorta. SKELETON: No focal hypermetabolic activity to suggest skeletal metastasis. Incidental CT findings: None IMPRESSION: 1. Resolution of the hypermetabolic supraglottic mass. 2. Residual asymmetric hypermetabolic activity localizing LEFT larynx and arytenoid cartilage. Recommend clinical correlation. 3. No evidence of metastatic adenopathy in the neck. 4. No evidence distant metastatic disease. 5. Coronary artery calcification and Aortic Atherosclerosis (ICD10-I70.0). Electronically Signed   By: Suzy Bouchard M.D.   On: 07/25/2019 16:07     Subjective: Pt left AMA    Discharge Exam: Vitals:   08/13/19 0530 08/13/19 0600  BP: (!) 147/87 (!) 147/95  Pulse: (!) 58 61  Resp:    Temp:    SpO2: 97% 99%   Vitals:   08/13/19 0130 08/13/19 0500 08/13/19 0530 08/13/19 0600  BP: (!) 152/90 (!) 145/79 (!) 147/87 (!) 147/95  Pulse:  75 (!) 58 61  Resp: 15     Temp:      TempSrc:      SpO2:  98% 97% 99%  Weight:      Height:  No PE as pt left AMA before I had a chance to see the pt   The results of significant diagnostics from this hospitalization (including imaging, microbiology, ancillary and  laboratory) are listed below for reference.     Microbiology: Recent Results (from the past 240 hour(s))  SARS Coronavirus 2 by RT PCR (hospital order, performed in [facility] hospital lab) Nasopharyngeal Nasopharyngeal Swab     Status: None   Collection Time: 08/13/19 12:51 AM   Specimen: Nasopharyngeal Swab  Result Value Ref Range Status   SARS Coronavirus 2 NEGATIVE NEGATIVE Final    Comment: (NOTE) SARS-CoV-2 target nucleic acids are NOT DETECTED.  The SARS-CoV-2 RNA is generally detectable in upper and lower respiratory specimens during the acute phase of infection. The lowest concentration of SARS-CoV-2 viral copies this assay can detect is 250 copies / mL. A negative result does not preclude SARS-CoV-2 infection and should not be used as the sole basis for treatment or other patient management decisions.  A negative result may occur with improper specimen collection / handling, submission of specimen other than nasopharyngeal swab, presence of viral mutation(s) within the areas targeted by this assay, and inadequate number of viral copies (<250 copies / mL). A negative result must be combined with clinical observations, patient history, and epidemiological information.  Fact Sheet for Patients:   StrictlyIdeas.no  Fact Sheet for Healthcare Providers: BankingDealers.co.za  This test is not yet approved or  cleared by the [place] FDA and has been authorized for detection and/or diagnosis of SARS-CoV-2 by FDA under an Emergency Use Authorization (EUA).  This EUA will remain in effect (meaning this test can be used) for the duration of the COVID-19 declaration under Section 564(b)(1) of the Act, 21 U.S.C. section 360bbb-3(b)(1), unless the authorization is terminated or revoked sooner.  Performed at [facility], [address] [place],  [address]      Labs: BNP (last 3 results) No results for  input(s): BNP in the last 8760 hours. Basic Metabolic Panel: Recent Labs  Lab 08/12/19 2247  NA 141  K 3.9  CL 104  CO2 25  GLUCOSE 80  BUN 11  CREATININE 1.12  CALCIUM 9.5   Liver Function Tests: Recent Labs  Lab 08/12/19 2247  AST 28  ALT 19  ALKPHOS 62  BILITOT 1.0  PROT 8.5*  ALBUMIN 4.1   No results for input(s): LIPASE, AMYLASE in the last 168 hours. No results for input(s): AMMONIA in the last 168 hours. CBC: Recent Labs  Lab 08/12/19 2247  WBC 9.4  NEUTROABS 6.8  HGB 13.4  HCT 41.5  MCV 102.2*  PLT 250   Cardiac Enzymes: No results for input(s): CKTOTAL, CKMB, CKMBINDEX, TROPONINI in the last 168 hours. BNP: Invalid input(s): POCBNP CBG: No results for input(s): GLUCAP in the last 168 hours. D-Dimer No results for input(s): DDIMER in the last 72 hours. Hgb A1c No results for input(s): HGBA1C in the last 72 hours. Lipid Profile No results for input(s): CHOL, HDL, LDLCALC, TRIG, CHOLHDL, LDLDIRECT in the last 72 hours. Thyroid function studies No results for input(s): TSH, T4TOTAL, T3FREE, THYROIDAB in the last 72 hours.  Invalid input(s): FREET3 Anemia work up No results for input(s): VITAMINB12, FOLATE, FERRITIN, TIBC, IRON, RETICCTPCT in the last 72 hours. Urinalysis    Component Value Date/Time   APPEARANCEUR Clear 09/11/2015 1020   GLUCOSEU Negative 09/11/2015 1020   BILIRUBINUR Negative 09/11/2015 1020   PROTEINUR Trace 09/11/2015 1020   NITRITE Negative 09/11/2015  1020   LEUKOCYTESUR Negative 09/11/2015 1020   Sepsis Labs Invalid input(s): PROCALCITONIN,  WBC,  LACTICIDVEN Microbiology Recent Results (from the past 240 hour(s))  SARS Coronavirus 2 by RT PCR (hospital order, performed in [facility] hospital lab) Nasopharyngeal Nasopharyngeal Swab     Status: None   Collection Time: 08/13/19 12:51 AM   Specimen: Nasopharyngeal Swab  Result Value Ref Range Status   SARS Coronavirus 2 NEGATIVE NEGATIVE Final    Comment:  (NOTE) SARS-CoV-2 target nucleic acids are NOT DETECTED.  The SARS-CoV-2 RNA is generally detectable in upper and lower respiratory specimens during the acute phase of infection. The lowest concentration of SARS-CoV-2 viral copies this assay can detect is 250 copies / mL. A negative result does not preclude SARS-CoV-2 infection and should not be used as the sole basis for treatment or other patient management decisions.  A negative result may occur with improper specimen collection / handling, submission of specimen other than nasopharyngeal swab, presence of viral mutation(s) within the areas targeted by this assay, and inadequate number of viral copies (<250 copies / mL). A negative result must be combined with clinical observations, patient history, and epidemiological information.  Fact Sheet for Patients:   StrictlyIdeas.no  Fact Sheet for Healthcare Providers: BankingDealers.co.za  This test is not yet approved or  cleared by the [place] FDA and has been authorized for detection and/or diagnosis of SARS-CoV-2 by FDA under an Emergency Use Authorization (EUA).  This EUA will remain in effect (meaning this test can be used) for the duration of the COVID-19 declaration under Section 564(b)(1) of the Act, 21 U.S.C. section 360bbb-3(b)(1), unless the authorization is terminated or revoked sooner.  Performed at [facility], [address] [place], [place] [address]      Time coordinating discharge: Over 30 minutes  SIGNED:   Wyvonnia Dusky, MD  [facility] 08/13/2019, 2:38 PM Pager   If 7PM-7AM, please contact night-coverage [email or web address]

## 2019-08-13 NOTE — H&P (Signed)
[facility] [place] NAME: Tyler Holloway    MR#:  [id number]  DATE OF BIRTH:  [birth year]  DATE OF ADMISSION:  08/12/2019  PRIMARY CARE PHYSICIAN: Patient, No Pcp Per   REQUESTING/REFERRING PHYSICIAN: Hinda Kehr, MD CHIEF COMPLAINT:   Chief Complaint  Patient presents with  . Sore Throat    HISTORY OF PRESENT ILLNESS:  Tyler Holloway  is a 59 y.o. African-American male with a known history of hypertension, throat cancer status post radiotherapy, hepatitis B and C, peripheral neuropathy, alcohol and cocaine abuse and BPH, who presented to the emergency room with acute onset of worsening sore throat for the last 3 days with associated spitting blood and likely hemoptysis.  He has had pain since her radiation therapy which has been a few months.  He is currently not undergoing any therapy for it.  He denied any fever or chills or nausea or vomiting or abdominal pain or diarrhea.  No chest pain or palpitations.  No significant dyspnea or cough or wheezing.  He had a couple of beers earlier today and did smoke crack cocaine.  No known history of esophageal varices.  Upon presentation to the emergency room, heart rate was 104 and otherwise vital signs were within normal.  Later blood pressure was 162/114 with a heart rate of 97.  Labs revealed unremarkable CMP and CBC are unremarkable.  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  There was asymmetric edema seen involving the left aryepiglottic fold and adjacent left partial epiglottic fat without definite discrete or identifiable mass by CT.  Showed retained secretions the patient had retained secretions in the hypopharynx extending into the left greater than right pyriform sinuses with a degree of superimposed ulceration suspected on the left.   No new adenopathy within the neck.  It also showed aortic  atherosclerosis and emphysema.  He was given IV Rocephin and clindamycin as well as Cyklokapron 1 g IV and 125 mL IV normal saline per hour.  He will admitted to a progressive unit bed for further evaluation and management. PAST MEDICAL HISTORY:   Past Medical History:  Diagnosis Date  . Hypertension   Throat cancer status post radiotherapy Hepatitis B infection Peripheral neuropathy BPH Hepatitis C antibody positive  PAST SURGICAL HISTORY:   Past Surgical History:  Procedure Laterality Date  . LARYNGOSCOPY Bilateral 03/02/2019   Procedure: DIRECT LARYNGOSCOPY W/ BX.;  Surgeon: Margaretha Sheffield, MD;  Location: [facility];  Service: ENT;  Laterality: Bilateral;  . NO PAST SURGERIES    . RIGID ESOPHAGOSCOPY Bilateral 03/02/2019   Procedure: RIGID ESOPHAGOSCOPY RIGID BRONCOSCOPY;  Surgeon: Margaretha Sheffield, MD;  Location: [facility];  Service: ENT;  Laterality: Bilateral;    SOCIAL HISTORY:   Social History   Tobacco Use  . Smoking status: Current Every Day Smoker    Packs/day: 0.25    Years: 45.00    Pack years: 11.25    Types: Cigarettes  . Smokeless tobacco: Current User  . Tobacco comment: 10 cig a day  Substance Use Topics  . Alcohol use: Yes    Alcohol/week: 7.0 standard drinks    Types: 7 Cans of beer per week    Comment: 1/2 pint a day of liquor    FAMILY HISTORY:   Family History  Problem Relation Age  of Onset  . Diabetes Mellitus I Mother   . Pancreatic cancer Mother   . Stroke Mother   . Heart Problems Sister   . Thyroid disease Sister   . Hypertension Sister   . Cervical cancer Sister     DRUG ALLERGIES:   Allergies  Allergen Reactions  . Pollen Extract Other (See Comments)    sneezing    REVIEW OF SYSTEMS:   ROS As per history of present illness. All pertinent systems were reviewed above. Constitutional, HEENT, cardiovascular, respiratory, GI, GU, musculoskeletal, neuro,  psychiatric, endocrine, integumentary and hematologic systems were reviewed and are otherwise negative/unremarkable except for positive findings mentioned above in the HPI.   MEDICATIONS AT HOME:   Prior to Admission medications   Medication Sig Start Date End Date Taking? Authorizing Provider  dexamethasone (DECADRON) 2 MG tablet Take 1 tablet (2 mg total) by mouth 2 (two) times daily. Patient not taking: Reported on 07/17/2019 06/21/19   Noreene Filbert, MD  pantoprazole (PROTONIX) 20 MG tablet Take 1 tablet (20 mg total) by mouth daily. Patient not taking: Reported on 08/13/2019 07/17/19   Sindy Guadeloupe, MD  sucralfate (CARAFATE) 1 GM/10ML suspension Take 10 mLs (1 g total) by mouth 4 (four) times daily -  with meals and at bedtime. Patient not taking: Reported on 07/17/2019 07/06/19   Borders, Kirt Boys, NP      VITAL SIGNS:  Blood pressure (!) 152/90, pulse 97, temperature 98.4 F (36.9 C), temperature source Oral, resp. rate 15, height 6\' 3"  (1.905 m), weight 65.8 kg, SpO2 99 %.  PHYSICAL EXAMINATION:  Physical Exam  GENERAL:  59 y.o.-year-old African-American male patient lying in the bed with no acute distress.  EYES: Pupils equal, round, reactive to light and accommodation. No scleral icterus. Extraocular muscles intact.  HEENT: Head atraumatic, normocephalic. Oropharynx and nasopharynx clear.  NECK:  Supple, no jugular venous distention. No thyroid enlargement, no tenderness.  LUNGS: Diminished bibasal breath sounds with bibasal crackles.   CARDIOVASCULAR: Regular rate and rhythm, S1, S2 normal. No murmurs, rubs, or gallops.  ABDOMEN: Soft, nondistended, nontender. Bowel sounds present. No organomegaly or mass.  EXTREMITIES: No pedal edema, cyanosis, or clubbing.  NEUROLOGIC: Cranial nerves II through XII are intact. Muscle strength 5/5 in all extremities. Sensation intact. Gait not checked.  PSYCHIATRIC: The patient is alert and oriented x 3.  Normal affect and good eye  contact. SKIN: No obvious rash, lesion, or ulcer.   LABORATORY PANEL:   CBC Recent Labs  Lab 08/12/19 2247  WBC 9.4  HGB 13.4  HCT 41.5  PLT 250   ------------------------------------------------------------------------------------------------------------------  Chemistries  Recent Labs  Lab 08/12/19 2247  NA 141  K 3.9  CL 104  CO2 25  GLUCOSE 80  BUN 11  CREATININE 1.12  CALCIUM 9.5  AST 28  ALT 19  ALKPHOS 62  BILITOT 1.0   ------------------------------------------------------------------------------------------------------------------  Cardiac Enzymes No results for input(s): TROPONINI in the last 168 hours. ------------------------------------------------------------------------------------------------------------------  RADIOLOGY:  CT Soft Tissue Neck W Contrast  Result Date: 08/13/2019 CLINICAL DATA:  Initial evaluation for hemoptysis. History of head neck cancer, status post XRT. EXAM: CT NECK WITH CONTRAST TECHNIQUE: Multidetector CT imaging of the neck was performed using the standard protocol following the bolus administration of intravenous contrast. CONTRAST:  171mL OMNIPAQUE IOHEXOL 300 MG/ML  SOLN COMPARISON:  Prior study from 02/21/2019. FINDINGS: Pharynx and larynx: Oral cavity within normal limits. No acute abnormality about the remaining dentition. No base of tongue lesion. Oropharynx and  nasopharynx within normal limits. Layering secretions noted within the hypopharynx. 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 The left aryepiglottic fold is asymmetrically enlarged and edematous, with infiltration of the adjacent left paraglottic fat. Retained secretions extends into the left greater than right piriform sinuses, with possible ulceration on the left (series 3, images 70, 73). Asymmetric sclerosis involving the left  arytenoid cartilage noted. The left true cord is edematous and medialized towards the midline. Glottis is largely effaced on this exam. Asymmetric sclerosis also noted within the left thyroid cartilage without osseous erosion. Mild induration within the adjacent left parapharyngeal soft tissues. Preponderance of these changes favored to reflect post radiation changes. No definite discrete mass evident by CT. The subglottic airway remains clear. Salivary glands: Salivary glands including the parotid and submandibular glands are within normal limits. Thyroid: Thyroid remains within normal limits. Lymph nodes: No new enlarged or pathologic appearing adenopathy identified within the neck. Vascular: Normal intravascular enhancement seen throughout the neck. Moderate atherosclerotic change present about the aortic arch, carotid bifurcations, and visualized carotid siphons. Limited intracranial: Unremarkable. Visualized orbits: Visualized globes and orbital soft tissues within normal limits. Mastoids and visualized paranasal sinuses: Mild mucosal thickening noted within the maxillary sinuses bilaterally. Visualized paranasal sinuses are otherwise clear. Visualize mastoids and middle ear cavities are well pneumatized and free of fluid. Skeleton: No acute osseous abnormality. No new discrete or worrisome osseous lesions. Probable small benign bone island noted within the T3 vertebral body, stable. Moderate multilevel cervical spondylosis, most pronounced at C5-6 and C6-7. Upper chest: Better evaluated on concomitant CT of the chest. Emphysematous changes again noted. Other: None. IMPRESSION: 1. 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 Asymmetric edema seen involving the left aryepiglottic fold and adjacent left para epiglottic fat without definite discrete or  identifiable mass by CT. Retained secretions present within the hypopharynx extending into the left greater than right piriform sinuses. A degree of superimposed ulceration is suspected on the left. 2. No new adenopathy within the neck. 3. Aortic Atherosclerosis (ICD10-I70.0) and Emphysema (ICD10-J43.9). Electronically Signed   By: Jeannine Boga M.D.   On: 08/13/2019 02:08   CT Chest W Contrast  Result Date: 08/13/2019 CLINICAL DATA:  59 year old male with hemoptysis. History of head neck cancer with sore throat since radiation approximately a month ago. EXAM: CT CHEST WITH CONTRAST TECHNIQUE: Multidetector CT imaging of the chest was performed during intravenous contrast administration. CONTRAST:  1108mL OMNIPAQUE IOHEXOL 300 MG/ML  SOLN COMPARISON:  None. FINDINGS: Cardiovascular: There is no cardiomegaly or pericardial effusion. Advanced 3 vessel coronary vascular calcification. Moderate atherosclerotic calcification of the thoracic aorta. No aneurysmal dilatation or dissection. The origins of the great vessels of the aortic arch appear patent as visualized. The central pulmonary arteries appear unremarkable for the degree of opacification. Mediastinum/Nodes: There is no hilar or mediastinal adenopathy. Small right hilar lymph node measures approximately 6 mm in short axis. The esophagus and the thyroid gland are grossly unremarkable. No mediastinal fluid collection. Lungs/Pleura: Patchy area of nodular density with tree-in-bud appearance in the medial basal left lower lobe as well as nodular density in the right middle lobe most consistent with pneumonia. Aspiration is not excluded. Clinical correlation is recommended. No lobar consolidation, pleural effusion, or pneumothorax. The central  airways are patent. Upper Abdomen: Probable fatty liver. Indeterminate ill-defined hypodense area in the left lobe of the liver, likely artifactual. Musculoskeletal: No chest wall abnormality. No acute or significant  osseous findings. Partially visualized soft tissue thickening in the lower neck in keeping with history of radiation. This is better evaluated on the neck CT. IMPRESSION: 1. Multifocal pneumonia. Aspiration is not excluded. Clinical correlation is recommended. 2. Partially visualized soft tissue thickening in the lower neck in keeping with history of radiation. This is better evaluated on the neck CT. 3. Aortic Atherosclerosis (ICD10-I70.0). Electronically Signed   By: Anner Crete M.D.   On: 08/13/2019 00:50      IMPRESSION AND PLAN:   1.  Multifocal pneumonia likely aspiration. -The patient will be admitted to a medical monitored bed. -We will continue antibiotic therapy with IV Unasyn for the possibility of aspiration pneumonia and Zithromax. -Mucolytic therapy will be provided. -We will follow sputum culture as well as urine pneumonia antigens in addition to blood cultures.  2.  Hemoptysis.  This could be related to throat ulcer on the left. -ENT consultation will be obtained. -I notified Dr. Kathyrn Sheriff about the patient. -The patient received a gram of IV Cyklokapron already in the ER -Naprosyn will be held off.  3.  Throat squamous cell carcinoma. -Pain management will be provided. -Oncology consultation will be obtained -Dr. Tasia Catchings was notified about the patient.  4.  GERD. -PPI therapy will be resumed and will continue Carafate.. -Lantus will be held off.  5.  DVT prophylaxis. -SCDs. -Medical prophylaxis currently contraindicated due to hemoptysis.   All the records are reviewed and case discussed with ED provider. The plan of care was discussed in details with the patient (and family). I answered all questions. The patient agreed to proceed with the above mentioned plan. Further management will depend upon hospital course.   CODE STATUS: Full code  Status is: Inpatient  Remains inpatient appropriate because:Ongoing active pain requiring inpatient pain management,  Ongoing diagnostic testing needed not appropriate for outpatient work up, Unsafe d/c plan, IV treatments appropriate due to intensity of illness or inability to take PO and Inpatient level of care appropriate due to severity of illness   Dispo: The patient is from: Home              Anticipated d/c is to: Home              Anticipated d/c date is: 2 days              Patient currently is not medically stable to d/c.   TOTAL TIME TAKING CARE OF THIS PATIENT: 55 minutes.    Christel Mormon M.D on 08/13/2019 at 3:39 AM  [facility]   From 7 PM-7 AM, contact night-coverage [email or web address]  CC: Primary care physician; Patient, No Pcp Per   Note: This dictation was prepared with Dragon dictation along with smaller phrase technology. Any transcriptional typo errors that result from this process are unintentional.

## 2019-08-13 NOTE — ED Notes (Signed)
Patient to CT via stretcher.

## 2019-08-13 NOTE — ED Notes (Signed)
Pt wishes to wait until he sees the doctor to get blood drawn. Pt just had labs drawn last night.

## 2019-08-13 NOTE — ED Triage Notes (Signed)
Pt comes via POV from home wit hc/o vomiting up blood. Pt states this started last night and he came to ED. Pt states he was to be admitted but had to leave due to needing to lock up house.  Pt states dark blood. Pt states radiation couple months ago.

## 2019-08-14 ENCOUNTER — Other Ambulatory Visit: Payer: Self-pay

## 2019-08-14 ENCOUNTER — Emergency Department
Admission: EM | Admit: 2019-08-14 | Discharge: 2019-08-14 | Discharge status: Against Medical Advice | Payer: BC Managed Care – PPO | Attending: Emergency Medicine | Admitting: Emergency Medicine

## 2019-08-14 ENCOUNTER — Encounter: Payer: Self-pay | Admitting: Emergency Medicine

## 2019-08-14 DIAGNOSIS — F17210 Nicotine dependence, cigarettes, uncomplicated: Secondary | ICD-10-CM | POA: Diagnosis not present

## 2019-08-14 DIAGNOSIS — R042 Hemoptysis: Secondary | ICD-10-CM | POA: Diagnosis not present

## 2019-08-14 DIAGNOSIS — I10 Essential (primary) hypertension: Secondary | ICD-10-CM | POA: Insufficient documentation

## 2019-08-14 DIAGNOSIS — Z85818 Personal history of malignant neoplasm of other sites of lip, oral cavity, and pharynx: Secondary | ICD-10-CM | POA: Insufficient documentation

## 2019-08-14 HISTORY — DX: Malignant (primary) neoplasm, unspecified: C80.1

## 2019-08-14 LAB — CBC
HCT: 38.5 % — ABNORMAL LOW (ref 39.0–52.0)
Hemoglobin: 13.3 g/dL (ref 13.0–17.0)
MCH: 34 pg (ref 26.0–34.0)
MCHC: 34.5 g/dL (ref 30.0–36.0)
MCV: 98.5 fL (ref 80.0–100.0)
Platelets: 242 10*3/uL (ref 150–400)
RBC: 3.91 MIL/uL — ABNORMAL LOW (ref 4.22–5.81)
RDW: 13 % (ref 11.5–15.5)
WBC: 5.7 10*3/uL (ref 4.0–10.5)
nRBC: 0 % (ref 0.0–0.2)

## 2019-08-14 LAB — BASIC METABOLIC PANEL
Anion gap: 10 (ref 5–15)
BUN: 14 mg/dL (ref 6–20)
CO2: 25 mmol/L (ref 22–32)
Calcium: 9.2 mg/dL (ref 8.9–10.3)
Chloride: 103 mmol/L (ref 98–111)
Creatinine, Ser: 1.1 mg/dL (ref 0.61–1.24)
GFR calc Af Amer: >60 mL/min (ref >60)
GFR calc non Af Amer: >60 mL/min (ref >60)
Glucose, Bld: 128 mg/dL — ABNORMAL HIGH (ref 70–99)
Potassium: 3.7 mmol/L (ref 3.5–5.1)
Sodium: 138 mmol/L (ref 135–145)

## 2019-08-14 LAB — PROCALCITONIN: Procalcitonin: <0.1 ng/mL

## 2019-08-14 NOTE — ED Provider Notes (Signed)
[facility] Emergency Department Provider Note  ____________________________________________   First MD Initiated Contact with Patient 08/14/19 1734     (approximate)  I have reviewed the triage vital signs and the nursing notes.   HISTORY  Chief Complaint Hemoptysis    HPI Tyler Holloway is a 59 y.o. male with hypertension, throat cancer status post radiotherapy, hepatitis B and C, peripheral neuropathy, alcohol and cocaine abuse who comes in for hemoptysis.  Patient was supposed to be admitted on 7/17 but left AMA. atient has been spitting up blood likely hemoptysis.  Patient had a CT scan showing squamous cell carcinoma on the posterior left arytenoid cartilage.  He also had a CT scan showing multifocal pneumonia.  Patient reports that he had to leave because he had a lock up his house.  He returned the next day but then left again without being seen due to the long wait times.  Patient now returns for the third time.  Patient is not on any blood thinners.  And been discussed with Dr. Tasia Catchings from oncology who want to have the patient admitted for possible laryngoscopy versus bronchoscopy.  The also put him on antibiotics for possible aspiration.  They put him on ceftriaxone and clindamycin. Pt denies any cough or fevers. Denies any sob. Reports throat pain, there for months, not better with pain meds, nothing makes it worse.  Patient is adamant that he is not been vomiting any blood.  He does feel like his stool looks darker than normal.          Past Medical History:  Diagnosis Date  . Cancer ([place])   . Hypertension     Patient Active Problem List   Diagnosis Date Noted  . Multifocal pneumonia 08/13/2019  . Goals of care, counseling/discussion 03/16/2019  . 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 HBV (hepatitis B virus) infection 11/04/2015  . Hepatitis B infection without delta agent without hepatic coma  09/11/2015  . HCV antibody positive 04/26/2015  . Peripheral neuropathy 02/14/2015  . Benign prostatic hyperplasia 02/14/2015    Past Surgical History:  Procedure Laterality Date  . LARYNGOSCOPY Bilateral 03/02/2019   Procedure: DIRECT LARYNGOSCOPY W/ BX.;  Surgeon: Margaretha Sheffield, MD;  Location: [facility];  Service: ENT;  Laterality: Bilateral;  . NO PAST SURGERIES    . RIGID ESOPHAGOSCOPY Bilateral 03/02/2019   Procedure: RIGID ESOPHAGOSCOPY RIGID BRONCOSCOPY;  Surgeon: Margaretha Sheffield, MD;  Location: [facility];  Service: ENT;  Laterality: Bilateral;    Prior to Admission medications   Medication Sig Start Date End Date Taking? Authorizing Provider  dexamethasone (DECADRON) 2 MG tablet Take 1 tablet (2 mg total) by mouth 2 (two) times daily. Patient not taking: Reported on 07/17/2019 06/21/19   Noreene Filbert, MD  pantoprazole (PROTONIX) 20 MG tablet Take 1 tablet (20 mg total) by mouth daily. Patient not taking: Reported on 08/13/2019 07/17/19   Sindy Guadeloupe, MD  sucralfate (CARAFATE) 1 GM/10ML suspension Take 10 mLs (1 g total) by mouth 4 (four) times daily -  with meals and at bedtime. Patient not taking: Reported on 07/17/2019 07/06/19   Borders, Kirt Boys, NP    Allergies Pollen extract  Family History  Problem Relation Age of Onset  . Diabetes Mellitus I Mother   . Pancreatic cancer Mother   . Stroke Mother   . Heart Problems Sister   . Thyroid disease Sister   . Hypertension Sister   .  Cervical cancer Sister     Social History Social History   Tobacco Use  . Smoking status: Current Every Day Smoker    Packs/day: 0.25    Years: 45.00    Pack years: 11.25    Types: Cigarettes  . Smokeless tobacco: Current User  Vaping Use  . Vaping Use: Never used  Substance Use Topics  . Alcohol use: Yes    Alcohol/week: 7.0 standard drinks    Types: 7 Cans of beer per week    Comment: 1/2 pint a day of liquor  . Drug use: Yes    Comment: Crack       Review of Systems Constitutional: No fever/chills Eyes: No visual changes. ENT: .  Throat pain Cardiovascular: Denies chest pain. Respiratory: Denies shortness of breath. Gastrointestinal: No abdominal pain.  No nausea, no vomiting.  No diarrhea.  No constipation. Genitourinary: Negative for dysuria. Musculoskeletal: Negative for back pain. Skin: Negative for rash. Neurological: Negative for headaches, focal weakness or numbness. All other ROS negative ____________________________________________   PHYSICAL EXAM:  VITAL SIGNS: ED Triage Vitals  Enc Vitals Group     BP 08/14/19 1308 (!) 153/84     Pulse Rate 08/14/19 1308 100     Resp 08/14/19 1308 16     Temp 08/14/19 1308 99.1 F (37.3 C)     Temp Source 08/14/19 1308 Oral     SpO2 08/14/19 1308 100 %     Weight 08/14/19 1309 150 lb (68 kg)     Height 08/14/19 1309 6\' 3"  (1.905 m)     Head Circumference --      Peak Flow --      Pain Score 08/14/19 1308 10     Pain Loc --      Pain Edu? --      Excl. in [place]? --     Constitutional: Alert and oriented. Well appearing and in no acute distress. Eyes: Conjunctivae are normal. EOMI. Head: Atraumatic. Nose: No congestion/rhinnorhea. Mouth/Throat: Mucous membranes are moist.   Neck: No stridor. Trachea Midline. FROM Cardiovascular: Normal rate, regular rhythm. Grossly normal heart sounds.  Good peripheral circulation. Respiratory: Normal respiratory effort.  No retractions. Lungs CTAB. Gastrointestinal: Soft and nontender. No distention. No abdominal bruits.  Musculoskeletal: No lower extremity tenderness nor edema.  No joint effusions. Neurologic:  Normal speech and language. No gross focal neurologic deficits are appreciated.  Skin:  Skin is warm, dry and intact. No rash noted. Psychiatric: Mood and affect are normal. Speech and behavior are normal. GU: Brown stool, Hemoccult positive  ____________________________________________   LABS (all labs ordered are  listed, but only abnormal results are displayed)  Labs Reviewed  CBC - Abnormal; Notable for the following components:      Result Value   RBC 3.91 (*)    HCT 38.5 (*)    All other components within normal limits  BASIC METABOLIC PANEL - Abnormal; Notable for the following components:   Glucose, Bld 128 (*)    All other components within normal limits  PROCALCITONIN  PROCALCITONIN   ____________________________________________   INITIAL IMPRESSION / ASSESSMENT AND PLAN / ED COURSE  Tyler Holloway was evaluated in Emergency Department on 08/14/2019 for the symptoms described in the history of present illness. He was evaluated in the context of the global COVID-19 pandemic, which necessitated consideration that the patient might be at risk for infection with the SARS-CoV-2 virus that causes COVID-19. Institutional protocols and algorithms that pertain to the evaluation of patients  at risk for COVID-19 are in a state of rapid change based on information released by regulatory bodies including the CDC and federal and state organizations. These policies and algorithms were followed during the patient's care in the ED.    Patient is a 59 year old who comes in for hemoptysis.  His hemoptysis has resolved although he had recent CT imaging concerning for aspiration pneumonia as well as possible ulcer in his throat.  Patient is adamant that he not vomit any blood.  His rectal exam is brown stool no evidence of melena.  His Hemoccult positive this could be from him swallowing some of the blood.  Labs were ordered to evaluate his hemoglobin.  He denies any symptoms to suggest active pneumonia or aspiration pneumonia.  Will need to discuss with oncology to decide patient's plan.  Patient states he has been here forever and he is hungry and does not want to stay.  Explained to patient I needed to talk to oncology to figure out a plan.  Patient's hemoglobin is stable. Kidney function is normal  I discussed  with Dr. Janese Banks who stated that there has been no proof that his cancer has returned.  The CT scan looks similar to prior and has had a prior scope that did not show return of the cancer.  She felt that the next upper step was to follow-up with ENT to get a repeat scope to see if the cancer has returned.  I attempted to call Dr. Kathyrn Sheriff from ENT.  However patient had taken out his IV and had eloped from the ER prior to me being able to talk to him.  ENT did call back and I told the them that the patient had already left but they stated that they would be willing to get an outpatient scope plan for patient.  I attempted to call patient I left a message for him to follow-up with ENT and the ENT doctor said that they would try to get his office to call him as well.  Patient had the capacity to leave.       ____________________________________________   FINAL CLINICAL IMPRESSION(S) / ED DIAGNOSES   Final diagnoses:  Hemoptysis      MEDICATIONS GIVEN DURING THIS VISIT:  Medications - No data to display   ED Discharge Orders    None       Note:  This document was prepared using Dragon voice recognition software and may include unintentional dictation errors.   Vanessa Lenoir, MD 08/14/19 Einar Crow

## 2019-08-14 NOTE — ED Notes (Signed)
PT requesting to have IV taken out so that he can leave. Upon initially entering room at 1755 pt was irritated d/t wait time but MD encouraged pt to wait so that she could speak to ENT and oncology. PT hit call bell to have IV taken out approx 1818 so that he could leave. Encouraged pt to please wait to hear what ENT has to say before we pull the IV bc/ if he gets admitted he will need an IV again. PT starts yelling and cussing that he wants to leave and go to [place]. While removing IV pt voices understanding that he is leaving against medical advice and will not stay. PT IV removed and bleeding controlled. PT ambulatory to exit.

## 2019-08-14 NOTE — ED Triage Notes (Addendum)
Pt in via POV, reports being seen for hemoptysis over the weekend with plans for admission to hospital but patient left AMA, here again yesterday for same but left prior to being seen due to 5 hour wait.  Pt states hemoptysis has resolved at this time.  Pt denies being on blood thinners.    Pt does report hx of Throat Cancer with previous radiation.  Vitals WDL, NAD noted.

## 2019-08-28 ENCOUNTER — Encounter: Payer: Self-pay | Admitting: Licensed Clinical Social Worker

## 2019-09-19 ENCOUNTER — Inpatient Hospital Stay: Payer: BC Managed Care – PPO | Admitting: Hospice and Palliative Medicine

## 2019-09-20 ENCOUNTER — Telehealth: Payer: Self-pay | Admitting: *Deleted

## 2019-09-20 NOTE — Telephone Encounter (Signed)
Pt had called and cancelled his pet scan and told the person on phone that he does not even know why he has to get it . His statement was :"I am done with yall and I am not doing this anymore because yall can fix my throat and dont find anything"  He later called back and rescheduled it. I called him and only got his voicemail and left message to explain that the PET scan is one way to see his disease status. The other way is seeing ENT for physical exam for his throat. At last visit it was explained to him that his throat pain and hoarseness. 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 We are willing if he wants to call and get an appt and or ref to another hospital. Left  My direct number

## 2019-10-23 ENCOUNTER — Other Ambulatory Visit: Payer: Self-pay | Admitting: *Deleted

## 2019-10-23 ENCOUNTER — Other Ambulatory Visit: Payer: Self-pay

## 2019-10-23 ENCOUNTER — Ambulatory Visit
Admission: RE | Admit: 2019-10-23 | Discharge: 2019-10-23 | Disposition: A | Discharge status: Home / Self Care | Payer: BC Managed Care – PPO | Source: Ambulatory Visit | Attending: Radiation Oncology | Admitting: Radiation Oncology

## 2019-10-23 ENCOUNTER — Encounter: Payer: Self-pay | Admitting: Radiation Oncology

## 2019-10-23 VITALS — BP 139/90 | HR 105 | Temp 98.1°F | Resp 16 | Wt 136.9 lb

## 2019-10-23 DIAGNOSIS — C139 Malignant neoplasm of hypopharynx, unspecified: Secondary | ICD-10-CM | POA: Diagnosis not present

## 2019-10-23 DIAGNOSIS — Z923 Personal history of irradiation: Secondary | ICD-10-CM | POA: Diagnosis not present

## 2019-10-23 DIAGNOSIS — C07 Malignant neoplasm of parotid gland: Secondary | ICD-10-CM

## 2019-10-23 MED ORDER — SUCRALFATE 1 GM/10ML PO SUSP: 1.0000 g | Freq: Three times a day (TID) | ORAL | 10 refills | Status: DC

## 2019-10-23 NOTE — Progress Notes (Signed)
Radiation Oncology Follow up Note  Name: Tyler Holloway   Date:   10/23/2019 MRN:  [id number] DOB: 06-19-1960    This 59 y.o. 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  Continues to have problems with what he describes as phlegm in his throat and overall pain.  His most recent PET CT scan back in June which I have reviewed shows resolution of the hypermetabolic supraglottic mass.  There are some slight residual asymmetric hypermetabolic to be localized in the left larynx.  He has been followed by Dr. Farrel Conners.  He has been having issues with pain control has had some emergency room visits and is also seen a palliative care in our clinic.  COMPLICATIONS OF TREATMENT: none  FOLLOW UP COMPLIANCE: keeps appointments   PHYSICAL EXAM:  BP 139/90 (BP Location: Left Arm, Patient Position: Sitting)   Pulse (!) 105   Temp 98.1 F (36.7 C) (Tympanic)   Resp 16   Wt 136 lb 14.4 oz (62.1 kg)   BMI 17.11 kg/m  Neck is clear without evidence of cervical or supraclavicular adenopathy.  Well-developed well-nourished patient in NAD. HEENT reveals PERLA, EOMI, discs not visualized.  Oral cavity is clear. No oral mucosal lesions are identified. Neck is clear without evidence of cervical or supraclavicular adenopathy. Lungs are clear to A&P. Cardiac examination is essentially unremarkable with regular rate and rhythm without murmur rub or thrill. Abdomen is benign with no organomegaly or masses noted. Motor sensory and DTR levels are equal and symmetric in the upper and lower extremities. Cranial nerves II through XII are grossly intact. Proprioception is intact. No  peripheral adenopathy or edema is identified. No motor or sensory levels are noted. Crude visual fields are within normal range.  RADIOLOGY RESULTS: PET CT scan reviewed compatible with above-stated findings  PLAN: Patient scheduled for another PET scan in November of asked to see him back shortly after that for follow-up.  He continues follow-up care with Dr. Farrel Conners.  I am starting him back on some Carafate rinses 3 times a day.  Patient will make a follow-up appointment with palliative care should he need further pain medication management.  I would like to take this opportunity to thank you for allowing me to participate in the care of your patient.Noreene Filbert, MD

## 2019-10-24 ENCOUNTER — Other Ambulatory Visit: Payer: Self-pay | Admitting: *Deleted

## 2019-10-24 MED ORDER — SUCRALFATE 1 G PO TABS: 1.0000 g | ORAL_TABLET | Freq: Three times a day (TID) | ORAL | 3 refills | Status: DC

## 2019-11-16 ENCOUNTER — Other Ambulatory Visit: Payer: Self-pay

## 2019-11-16 ENCOUNTER — Encounter: Payer: Self-pay | Admitting: Emergency Medicine

## 2019-11-16 ENCOUNTER — Emergency Department
Admission: EM | Admit: 2019-11-16 | Discharge: 2019-11-16 | Disposition: A | Discharge status: Home / Self Care | Payer: BC Managed Care – PPO | Attending: Emergency Medicine | Admitting: Emergency Medicine

## 2019-11-16 DIAGNOSIS — J029 Acute pharyngitis, unspecified: Secondary | ICD-10-CM | POA: Diagnosis present

## 2019-11-16 DIAGNOSIS — Z5321 Procedure and treatment not carried out due to patient leaving prior to being seen by health care provider: Secondary | ICD-10-CM | POA: Diagnosis not present

## 2019-11-16 NOTE — ED Triage Notes (Signed)
Patient with complaint of sore throat times 7- 8 months. Patient states that it has hurt ever since he had cancer. Patient states that the oncologist said that it will get better that it will take time.

## 2019-11-22 ENCOUNTER — Other Ambulatory Visit: Payer: Self-pay

## 2019-11-22 ENCOUNTER — Emergency Department
Admission: EM | Admit: 2019-11-22 | Discharge: 2019-11-22 | Disposition: A | Discharge status: Home / Self Care | Payer: BC Managed Care – PPO | Attending: Emergency Medicine | Admitting: Emergency Medicine

## 2019-11-22 DIAGNOSIS — Z85818 Personal history of malignant neoplasm of other sites of lip, oral cavity, and pharynx: Secondary | ICD-10-CM | POA: Insufficient documentation

## 2019-11-22 DIAGNOSIS — Z5321 Procedure and treatment not carried out due to patient leaving prior to being seen by health care provider: Secondary | ICD-10-CM | POA: Diagnosis not present

## 2019-11-22 DIAGNOSIS — R070 Pain in throat: Secondary | ICD-10-CM | POA: Insufficient documentation

## 2019-11-22 NOTE — ED Triage Notes (Signed)
Pt states he has had throat pain for 8 months since he had radiation for throat cancer. Last radiation treatment was in may and continues to have pain. Has called oncologist who states "It will get better in time". Pt states pain not worse today just persistent.

## 2019-11-24 ENCOUNTER — Telehealth: Payer: Self-pay | Admitting: *Deleted

## 2019-11-24 NOTE — Telephone Encounter (Signed)
Called pt because auth for pet has not been approved. We are cancelling pet scan appt. And the lab and MD appt  Until we can get the pet approved and then make all the appts in the future and contact pt. If pt has questions I left my direct number for him to call me

## 2019-11-27 ENCOUNTER — Inpatient Hospital Stay: Payer: BC Managed Care – PPO

## 2019-11-27 ENCOUNTER — Ambulatory Visit: Payer: BC Managed Care – PPO

## 2019-11-29 ENCOUNTER — Inpatient Hospital Stay: Payer: BC Managed Care – PPO

## 2019-11-29 ENCOUNTER — Other Ambulatory Visit: Payer: Self-pay

## 2019-11-29 ENCOUNTER — Inpatient Hospital Stay: Payer: BC Managed Care – PPO | Attending: Oncology | Admitting: Oncology

## 2019-11-29 ENCOUNTER — Other Ambulatory Visit: Payer: Self-pay | Admitting: Oncology

## 2019-11-29 ENCOUNTER — Encounter: Payer: BC Managed Care – PPO | Admitting: Oncology

## 2019-11-29 ENCOUNTER — Ambulatory Visit
Admission: RE | Admit: 2019-11-29 | Discharge: 2019-11-29 | Disposition: A | Discharge status: Home / Self Care | Payer: BC Managed Care – PPO | Source: Home / Self Care | Attending: Oncology | Admitting: Oncology

## 2019-11-29 ENCOUNTER — Ambulatory Visit
Admission: RE | Admit: 2019-11-29 | Discharge: 2019-11-29 | Disposition: A | Discharge status: Home / Self Care | Payer: BC Managed Care – PPO | Source: Ambulatory Visit | Attending: Oncology | Admitting: Oncology

## 2019-11-29 ENCOUNTER — Emergency Department
Admission: EM | Admit: 2019-11-29 | Discharge: 2019-11-29 | Disposition: A | Discharge status: Home / Self Care | Payer: BC Managed Care – PPO | Source: Home / Self Care

## 2019-11-29 DIAGNOSIS — Z85818 Personal history of malignant neoplasm of other sites of lip, oral cavity, and pharynx: Secondary | ICD-10-CM | POA: Insufficient documentation

## 2019-11-29 DIAGNOSIS — R062 Wheezing: Secondary | ICD-10-CM

## 2019-11-29 DIAGNOSIS — R0981 Nasal congestion: Secondary | ICD-10-CM | POA: Insufficient documentation

## 2019-11-29 DIAGNOSIS — R509 Fever, unspecified: Secondary | ICD-10-CM | POA: Insufficient documentation

## 2019-11-29 DIAGNOSIS — A419 Sepsis, unspecified organism: Secondary | ICD-10-CM | POA: Diagnosis not present

## 2019-11-29 DIAGNOSIS — J189 Pneumonia, unspecified organism: Secondary | ICD-10-CM | POA: Diagnosis not present

## 2019-11-29 DIAGNOSIS — Z5321 Procedure and treatment not carried out due to patient leaving prior to being seen by health care provider: Secondary | ICD-10-CM | POA: Insufficient documentation

## 2019-11-29 DIAGNOSIS — R059 Cough, unspecified: Secondary | ICD-10-CM | POA: Insufficient documentation

## 2019-11-29 LAB — COMPREHENSIVE METABOLIC PANEL
ALT: 33 U/L (ref 0–44)
AST: 68 U/L — ABNORMAL HIGH (ref 15–41)
Albumin: 3.2 g/dL — ABNORMAL LOW (ref 3.5–5.0)
Alkaline Phosphatase: 114 U/L (ref 38–126)
Anion gap: 12 (ref 5–15)
BUN: 19 mg/dL (ref 6–20)
CO2: 28 mmol/L (ref 22–32)
Calcium: 9.5 mg/dL (ref 8.9–10.3)
Chloride: 93 mmol/L — ABNORMAL LOW (ref 98–111)
Creatinine, Ser: 1.39 mg/dL — ABNORMAL HIGH (ref 0.61–1.24)
GFR, Estimated: 58 mL/min — ABNORMAL LOW (ref >60)
Glucose, Bld: 125 mg/dL — ABNORMAL HIGH (ref 70–99)
Potassium: 3.8 mmol/L (ref 3.5–5.1)
Sodium: 133 mmol/L — ABNORMAL LOW (ref 135–145)
Total Bilirubin: 2 mg/dL — ABNORMAL HIGH (ref 0.3–1.2)
Total Protein: 7.9 g/dL (ref 6.5–8.1)

## 2019-11-29 LAB — CBC WITH DIFFERENTIAL/PLATELET
Abs Immature Granulocytes: 1.72 10*3/uL — ABNORMAL HIGH (ref 0.00–0.07)
Basophils Absolute: 0.1 10*3/uL (ref 0.0–0.1)
Basophils Relative: 1 %
Eosinophils Absolute: 0 10*3/uL (ref 0.0–0.5)
Eosinophils Relative: 0 %
HCT: 37.2 % — ABNORMAL LOW (ref 39.0–52.0)
Hemoglobin: 12.1 g/dL — ABNORMAL LOW (ref 13.0–17.0)
Immature Granulocytes: 8 %
Lymphocytes Relative: 2 %
Lymphs Abs: 0.4 10*3/uL — ABNORMAL LOW (ref 0.7–4.0)
MCH: 29.8 pg (ref 26.0–34.0)
MCHC: 32.5 g/dL (ref 30.0–36.0)
MCV: 91.6 fL (ref 80.0–100.0)
Monocytes Absolute: 2.2 10*3/uL — ABNORMAL HIGH (ref 0.1–1.0)
Monocytes Relative: 11 %
Neutro Abs: 16 10*3/uL — ABNORMAL HIGH (ref 1.7–7.7)
Neutrophils Relative %: 78 %
Platelets: 344 10*3/uL (ref 150–400)
RBC: 4.06 MIL/uL — ABNORMAL LOW (ref 4.22–5.81)
RDW: 12.9 % (ref 11.5–15.5)
Smear Review: NORMAL
WBC: 20.4 10*3/uL — ABNORMAL HIGH (ref 4.0–10.5)
nRBC: 0 % (ref 0.0–0.2)

## 2019-11-29 LAB — LACTIC ACID, PLASMA: Lactic Acid, Venous: 1.7 mmol/L (ref 0.5–1.9)

## 2019-11-29 NOTE — ED Notes (Signed)
No answer when called several times from room  

## 2019-11-29 NOTE — ED Notes (Signed)
No answer when called several times from lobby & cell phone

## 2019-11-29 NOTE — ED Notes (Signed)
No answer when called several times from lobby, BR and outside

## 2019-11-29 NOTE — Progress Notes (Signed)
Michaela Corner to start antibiotics?  Faythe Casa, NP 11/29/2019 3:00 PM

## 2019-11-29 NOTE — Progress Notes (Signed)
Symptom Management Consult note [facility]  [phone] [phone]  Patient Care Team: Patient, No Pcp Per as PCP - General (General Practice)   Name of the patient: Tyler Holloway  [id number]  08-Mar-1960   Date of visit: 11/29/2019   Diagnosis- 1. Pneumonia due to infectious organism, unspecified laterality, unspecified part of lung - Comprehensive metabolic panel - Culture, blood (Routine X 2) w Reflex to ID Panel  Chief complaint/ Reason for visit-wheezing  Heme/Onc history:  Oncology History   No history exists.   Interval history-Tyler Holloway is a 59 year old male with past medical history significant for hepatitis B infection, peripheral neuropathy and laryngeal cancer.  He is status post radiation to the hypopharynx approximately 4 months ago.  Most recent imaging showed resolution of hypermetabolic mass.  There was some residual hypermetabolic activity in the left Larynex.  He is followed by ENT Dr. Farrel Conners.  He has a repeat PET scheduled for later this month.  Today, patient calls clinic complaining of wheezing for the past few weeks that appears to have worsened over the past few days.  Patient was asked to have a chest x-ray and present to symptom management for evaluation.  Endorses shortness of breath, sputum production and cough for the past few weeks.  States he feels like "crap".  He was able to tolerate food and liquids up until this past weekend.  On Saturday, he became nauseated and has been unable to keep anything down since. He feels dehydrated.  He denies any known fever or Covid exposure.  Denies any easy bleeding or bruising.  Denies any chest pain.  Is having normal bowel movements.  Denies any urinary discomfort.  ECOG FS:2 - Symptomatic, <50% confined to bed  Review of systems- Review of Systems  Constitutional: Positive for malaise/fatigue. Negative for chills, fever and weight loss.  HENT: Negative for congestion,  ear pain and tinnitus.   Eyes: Negative.  Negative for blurred vision and double vision.  Respiratory: Positive for cough, sputum production, shortness of breath and wheezing.   Cardiovascular: Negative.  Negative for chest pain, palpitations and leg swelling.  Gastrointestinal: Positive for nausea. Negative for abdominal pain, constipation, diarrhea and vomiting.  Genitourinary: Negative for dysuria, frequency and urgency.  Musculoskeletal: Negative for back pain and falls.  Skin: Negative.  Negative for rash.  Neurological: Positive for dizziness and weakness. Negative for headaches.  Endo/Heme/Allergies: Negative.  Does not bruise/bleed easily.  Psychiatric/Behavioral: Negative.  Negative for depression. The patient is not nervous/anxious and does not have insomnia.      Current treatment-status post radiation  Allergies  Allergen Reactions  . Pollen Extract Other (See Comments)     Past Medical History:  Diagnosis Date  . Cancer ([place])   . Hypertension      Past Surgical History:  Procedure Laterality Date  . LARYNGOSCOPY Bilateral 03/02/2019   Procedure: DIRECT LARYNGOSCOPY W/ BX.;  Surgeon: Margaretha Sheffield, MD;  Location: [facility];  Service: ENT;  Laterality: Bilateral;  . NO PAST SURGERIES    . RIGID ESOPHAGOSCOPY Bilateral 03/02/2019   Procedure: RIGID ESOPHAGOSCOPY RIGID BRONCOSCOPY;  Surgeon: Margaretha Sheffield, MD;  Location: [facility];  Service: ENT;  Laterality: Bilateral;    Social History   Socioeconomic History  . Marital status: Single    Spouse name: Not on file  . Number of children: Not on file  . Years of education: Not on file  . Highest education level: Not on file  Occupational History  . Not on file  Tobacco Use  . Smoking status: Current Every Day Smoker    Packs/day: 0.25    Years: 45.00    Pack years: 11.25    Types: Cigarettes  . Smokeless tobacco: Current User  Vaping Use  . Vaping Use: Never used  Substance and Sexual  Activity  . Alcohol use: Yes    Alcohol/week: 7.0 standard drinks    Types: 7 Cans of beer per week    Comment: 1/2 pint a day of liquor  . Drug use: Yes    Comment: Crack  . Sexual activity: Not Currently  Other Topics Concern  . Not on file  Social History Narrative  . Not on file   Social Determinants of Health   Financial Resource Strain:   . Difficulty of Paying Living Expenses: Not on file  Food Insecurity:   . Worried About Charity fundraiser in the Last Year: Not on file  . Ran Out of Food in the Last Year: Not on file  Transportation Needs:   . Lack of Transportation (Medical): Not on file  . Lack of Transportation (Non-Medical): Not on file  Physical Activity:   . Days of Exercise per Week: Not on file  . Minutes of Exercise per Session: Not on file  Stress:   . Feeling of Stress : Not on file  Social Connections:   . Frequency of Communication with Friends and Family: Not on file  . Frequency of Social Gatherings with Friends and Family: Not on file  . Attends Religious Services: Not on file  . Active Member of Clubs or Organizations: Not on file  . Attends Archivist Meetings: Not on file  . Marital Status: Not on file  Intimate Partner Violence:   . Fear of Current or Ex-Partner: Not on file  . Emotionally Abused: Not on file  . Physically Abused: Not on file  . Sexually Abused: Not on file    Family History  Problem Relation Age of Onset  . Diabetes Mellitus I Mother   . Pancreatic cancer Mother   . Stroke Mother   . Heart Problems Sister   . Thyroid disease Sister   . Hypertension Sister   . Cervical cancer Sister      Current Outpatient Medications:  .  sucralfate (CARAFATE) 1 g tablet, Take 1 tablet (1 g total) by mouth 3 (three) times daily. Dissolve in 3-4 tbsp warm water, swish and swallow., Disp: 90 tablet, Rfl: 3 .  dexamethasone (DECADRON) 2 MG tablet, Take 1 tablet (2 mg total) by mouth 2 (two) times daily. (Patient not  taking: Reported on 07/17/2019), Disp: 40 tablet, Rfl: 0 .  pantoprazole (PROTONIX) 20 MG tablet, Take 1 tablet (20 mg total) by mouth daily. (Patient not taking: Reported on 08/13/2019), Disp: 30 tablet, Rfl: 1  Physical exam:  Vitals:   11/29/19 1505  BP: 132/76  Pulse: (!) 110  Resp: (!) 21  Temp: (!) 101 F (38.3 C)  TempSrc: Tympanic  SpO2: 99%   Physical Exam Constitutional:      Appearance: Normal appearance. He is ill-appearing.  HENT:     Head: Normocephalic and atraumatic.  Eyes:     Pupils: Pupils are equal, round, and reactive to light.  Cardiovascular:     Rate and Rhythm: Regular rhythm. Tachycardia present.     Heart sounds: Normal heart sounds. No murmur heard.   Pulmonary:     Effort: Pulmonary effort is  normal.     Breath sounds: Decreased breath sounds, wheezing and rhonchi present.  Abdominal:     General: Bowel sounds are normal. There is no distension.     Palpations: Abdomen is soft.     Tenderness: There is no abdominal tenderness.  Musculoskeletal:        General: Normal range of motion.     Cervical back: Normal range of motion.  Skin:    General: Skin is warm and dry.     Findings: No rash.  Neurological:     Mental Status: He is alert and oriented to person, place, and time.  Psychiatric:        Judgment: Judgment normal.      CMP Latest Ref Rng & Units 08/14/2019  Glucose 70 - 99 mg/dL 128(H)  BUN 6 - 20 mg/dL 14  Creatinine 0.61 - 1.24 mg/dL 1.10  Sodium 135 - 145 mmol/L 138  Potassium 3.5 - 5.1 mmol/L 3.7  Chloride 98 - 111 mmol/L 103  CO2 22 - 32 mmol/L 25  Calcium 8.9 - 10.3 mg/dL 9.2  Total Protein 6.5 - 8.1 g/dL -  Total Bilirubin 0.3 - 1.2 mg/dL -  Alkaline Phos 38 - 126 U/L -  AST 15 - 41 U/L -  ALT 0 - 44 U/L -   CBC Latest Ref Rng & Units 08/14/2019  WBC 4.0 - 10.5 K/uL 5.7  Hemoglobin 13.0 - 17.0 g/dL 13.3  Hematocrit 39 - 52 % 38.5(L)  Platelets 150 - 400 K/uL 242    No images are attached to the encounter.  DG  Chest 2 View  Result Date: 11/29/2019 CLINICAL DATA:  Wheezing. EXAM: CHEST - 2 VIEW COMPARISON:  None. FINDINGS: The heart size and mediastinal contours are within normal limits. Hyperexpansion of the lungs is noted. No pneumothorax or pleural effusion is noted. Mild right basilar subsegmental atelectasis or scarring is noted. Left lower lobe airspace opacity is noted concerning for pneumonia. The visualized skeletal structures are unremarkable. IMPRESSION: Left lower lobe pneumonia. Mild right basilar subsegmental atelectasis or scarring. Hyperexpansion of the lungs. Electronically Signed   By: Marijo Conception M.D.   On: 11/29/2019 14:44    Assessment and plan- Patient is a 59 y.o. male who presents for a 3-week history of wheezing, coughing and sputum production.  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  PET scan from June 2021 showed resolution of hypermetabolic mass.  Mild residual hypermetabolic activity in left Larynex.  Scheduled for repeat PET later this month.  Wheezing/cough/sputum production-Mr. Taitt had chest x-ray prior to his appointment today which showed left lower lobe pneumonia.  He was brought in for lab work, assessment and to review imaging.  On assessment, he is tachycardic, tachypneic and febrile.  He is coughing up thick yellow mucus.  Oxygen saturations are stable.   Unfortunately, Mr. Ledyard will likely need an extensive work-up including IV fluids, lab work including cultures and possible IV antibiotics given his unstable vital signs. Attempted to get lab work but given his hydration status, would recommend.ED evaluation asap to not delay his care.  He was taken directly to the emergency room for further work-up and evaluation.  Disposition: Patient taken directly to ED for further work-up and evaluation. RTC as scheduled.    Visit Diagnosis 1. Pneumonia due to infectious organism, unspecified laterality,  unspecified part of lung     Patient expressed understanding and was in agreement with this plan. He  also understands that He can call clinic at any time with any questions, concerns, or complaints.   Greater than 50% was spent in counseling and coordination of care with this patient including but not limited to discussion of the relevant topics above (See A&P) including, but not limited to diagnosis and management of acute and chronic medical conditions.   Thank you for allowing me to participate in the care of this very pleasant patient.    Jacquelin Hawking, NP [facility] Cell - [phone] Pager- [phone] 11/29/2019 3:20 PM

## 2019-11-29 NOTE — Progress Notes (Signed)
Pt called into clinic reports wheezing and thick sputum production.

## 2019-11-29 NOTE — Progress Notes (Signed)
Re: Wheezing  Chest X-ray See [facility] after.   Faythe Casa, NP 11/29/2019 1:16 PM

## 2019-11-29 NOTE — ED Notes (Signed)
Xray performed at [facility] center earlier today showing pneumonia. Xray from order sets DC at this time.

## 2019-11-29 NOTE — ED Triage Notes (Signed)
Pt to ED from cancer center for pneumonia, cough, congestion that "started in April after radiation for throat cancer".  Denies CP, SHOB.  Reports pain to throat.  Pt in NAD  RR even and unlabored.   1 set of cultures sent to lab

## 2019-11-30 ENCOUNTER — Encounter: Payer: Self-pay | Admitting: Emergency Medicine

## 2019-11-30 ENCOUNTER — Inpatient Hospital Stay
Admission: EM | Admit: 2019-11-30 | Discharge: 2019-12-05 | DRG: 871 | Disposition: A | Discharge status: Hospice | Payer: BC Managed Care – PPO | Attending: Internal Medicine | Admitting: Internal Medicine

## 2019-11-30 ENCOUNTER — Ambulatory Visit: Payer: BC Managed Care – PPO | Admitting: Oncology

## 2019-11-30 ENCOUNTER — Emergency Department: Payer: BC Managed Care – PPO

## 2019-11-30 DIAGNOSIS — F17210 Nicotine dependence, cigarettes, uncomplicated: Secondary | ICD-10-CM | POA: Diagnosis present

## 2019-11-30 DIAGNOSIS — Z7289 Other problems related to lifestyle: Secondary | ICD-10-CM

## 2019-11-30 DIAGNOSIS — J189 Pneumonia, unspecified organism: Secondary | ICD-10-CM | POA: Diagnosis not present

## 2019-11-30 DIAGNOSIS — J690 Pneumonitis due to inhalation of food and vomit: Secondary | ICD-10-CM | POA: Diagnosis present

## 2019-11-30 DIAGNOSIS — N1831 Chronic kidney disease, stage 3a: Secondary | ICD-10-CM | POA: Diagnosis present

## 2019-11-30 DIAGNOSIS — Z681 Body mass index (BMI) 19 or less, adult: Secondary | ICD-10-CM

## 2019-11-30 DIAGNOSIS — Z833 Family history of diabetes mellitus: Secondary | ICD-10-CM

## 2019-11-30 DIAGNOSIS — F10231 Alcohol dependence with withdrawal delirium: Secondary | ICD-10-CM | POA: Diagnosis present

## 2019-11-30 DIAGNOSIS — I129 Hypertensive chronic kidney disease with stage 1 through stage 4 chronic kidney disease, or unspecified chronic kidney disease: Secondary | ICD-10-CM | POA: Diagnosis present

## 2019-11-30 DIAGNOSIS — Z923 Personal history of irradiation: Secondary | ICD-10-CM | POA: Diagnosis not present

## 2019-11-30 DIAGNOSIS — Z66 Do not resuscitate: Secondary | ICD-10-CM | POA: Diagnosis not present

## 2019-11-30 DIAGNOSIS — Z8349 Family history of other endocrine, nutritional and metabolic diseases: Secondary | ICD-10-CM | POA: Diagnosis not present

## 2019-11-30 DIAGNOSIS — Z800 Family history of malignant neoplasm of digestive organs: Secondary | ICD-10-CM

## 2019-11-30 DIAGNOSIS — Z515 Encounter for palliative care: Secondary | ICD-10-CM | POA: Diagnosis not present

## 2019-11-30 DIAGNOSIS — R6520 Severe sepsis without septic shock: Secondary | ICD-10-CM | POA: Diagnosis present

## 2019-11-30 DIAGNOSIS — R1310 Dysphagia, unspecified: Secondary | ICD-10-CM | POA: Diagnosis present

## 2019-11-30 DIAGNOSIS — Z8249 Family history of ischemic heart disease and other diseases of the circulatory system: Secondary | ICD-10-CM | POA: Diagnosis not present

## 2019-11-30 DIAGNOSIS — Z789 Other specified health status: Secondary | ICD-10-CM

## 2019-11-30 DIAGNOSIS — F10939 Alcohol use, unspecified with withdrawal, unspecified: Secondary | ICD-10-CM

## 2019-11-30 DIAGNOSIS — Z20822 Contact with and (suspected) exposure to covid-19: Secondary | ICD-10-CM | POA: Diagnosis present

## 2019-11-30 DIAGNOSIS — E43 Unspecified severe protein-calorie malnutrition: Secondary | ICD-10-CM | POA: Diagnosis present

## 2019-11-30 DIAGNOSIS — J988 Other specified respiratory disorders: Secondary | ICD-10-CM

## 2019-11-30 DIAGNOSIS — J9601 Acute respiratory failure with hypoxia: Secondary | ICD-10-CM | POA: Diagnosis present

## 2019-11-30 DIAGNOSIS — F1010 Alcohol abuse, uncomplicated: Secondary | ICD-10-CM

## 2019-11-30 DIAGNOSIS — Z888 Allergy status to other drugs, medicaments and biological substances status: Secondary | ICD-10-CM | POA: Diagnosis not present

## 2019-11-30 DIAGNOSIS — Z79899 Other long term (current) drug therapy: Secondary | ICD-10-CM

## 2019-11-30 DIAGNOSIS — K219 Gastro-esophageal reflux disease without esophagitis: Secondary | ICD-10-CM | POA: Diagnosis present

## 2019-11-30 DIAGNOSIS — A419 Sepsis, unspecified organism: Principal | ICD-10-CM | POA: Diagnosis present

## 2019-11-30 DIAGNOSIS — Z823 Family history of stroke: Secondary | ICD-10-CM | POA: Diagnosis not present

## 2019-11-30 DIAGNOSIS — C321 Malignant neoplasm of supraglottis: Secondary | ICD-10-CM | POA: Diagnosis present

## 2019-11-30 DIAGNOSIS — I10 Essential (primary) hypertension: Secondary | ICD-10-CM | POA: Diagnosis present

## 2019-11-30 DIAGNOSIS — Z8049 Family history of malignant neoplasm of other genital organs: Secondary | ICD-10-CM

## 2019-11-30 DIAGNOSIS — F10239 Alcohol dependence with withdrawal, unspecified: Secondary | ICD-10-CM | POA: Diagnosis present

## 2019-11-30 DIAGNOSIS — Z8521 Personal history of malignant neoplasm of larynx: Secondary | ICD-10-CM | POA: Diagnosis not present

## 2019-11-30 DIAGNOSIS — Z7189 Other specified counseling: Secondary | ICD-10-CM | POA: Diagnosis not present

## 2019-11-30 DIAGNOSIS — Z720 Tobacco use: Secondary | ICD-10-CM | POA: Diagnosis not present

## 2019-11-30 DIAGNOSIS — R059 Cough, unspecified: Secondary | ICD-10-CM | POA: Diagnosis present

## 2019-11-30 DIAGNOSIS — G9340 Encephalopathy, unspecified: Secondary | ICD-10-CM | POA: Diagnosis present

## 2019-11-30 DIAGNOSIS — N183 Chronic kidney disease, stage 3 unspecified: Secondary | ICD-10-CM | POA: Diagnosis present

## 2019-11-30 LAB — COMPREHENSIVE METABOLIC PANEL
ALT: 40 U/L (ref 0–44)
AST: 77 U/L — ABNORMAL HIGH (ref 15–41)
Albumin: 2.9 g/dL — ABNORMAL LOW (ref 3.5–5.0)
Alkaline Phosphatase: 107 U/L (ref 38–126)
Anion gap: 14 (ref 5–15)
BUN: 22 mg/dL — ABNORMAL HIGH (ref 6–20)
CO2: 26 mmol/L (ref 22–32)
Calcium: 9.2 mg/dL (ref 8.9–10.3)
Chloride: 94 mmol/L — ABNORMAL LOW (ref 98–111)
Creatinine, Ser: 1.33 mg/dL — ABNORMAL HIGH (ref 0.61–1.24)
GFR, Estimated: >60 mL/min (ref >60)
Glucose, Bld: 134 mg/dL — ABNORMAL HIGH (ref 70–99)
Potassium: 3.6 mmol/L (ref 3.5–5.1)
Sodium: 134 mmol/L — ABNORMAL LOW (ref 135–145)
Total Bilirubin: 1.8 mg/dL — ABNORMAL HIGH (ref 0.3–1.2)
Total Protein: 7.5 g/dL (ref 6.5–8.1)

## 2019-11-30 LAB — CBC WITH DIFFERENTIAL/PLATELET
Abs Immature Granulocytes: 0.12 10*3/uL — ABNORMAL HIGH (ref 0.00–0.07)
Basophils Absolute: 0 10*3/uL (ref 0.0–0.1)
Basophils Relative: 0 %
Eosinophils Absolute: 0 10*3/uL (ref 0.0–0.5)
Eosinophils Relative: 0 %
HCT: 36.8 % — ABNORMAL LOW (ref 39.0–52.0)
Hemoglobin: 12.3 g/dL — ABNORMAL LOW (ref 13.0–17.0)
Immature Granulocytes: 1 %
Lymphocytes Relative: 1 %
Lymphs Abs: 0.3 10*3/uL — ABNORMAL LOW (ref 0.7–4.0)
MCH: 30.1 pg (ref 26.0–34.0)
MCHC: 33.4 g/dL (ref 30.0–36.0)
MCV: 90.2 fL (ref 80.0–100.0)
Monocytes Absolute: 1.9 10*3/uL — ABNORMAL HIGH (ref 0.1–1.0)
Monocytes Relative: 9 %
Neutro Abs: 18.1 10*3/uL — ABNORMAL HIGH (ref 1.7–7.7)
Neutrophils Relative %: 89 %
Platelets: 329 10*3/uL (ref 150–400)
RBC: 4.08 MIL/uL — ABNORMAL LOW (ref 4.22–5.81)
RDW: 12.8 % (ref 11.5–15.5)
Smear Review: NORMAL
WBC: 20.4 10*3/uL — ABNORMAL HIGH (ref 4.0–10.5)
nRBC: 0 % (ref 0.0–0.2)

## 2019-11-30 LAB — URINALYSIS, COMPLETE (UACMP) WITH MICROSCOPIC
Bacteria, UA: NONE SEEN
Bilirubin Urine: NEGATIVE
Glucose, UA: NEGATIVE mg/dL
Ketones, ur: NEGATIVE mg/dL
Leukocytes,Ua: NEGATIVE
Nitrite: NEGATIVE
Protein, ur: 100 mg/dL — AB
Specific Gravity, Urine: 1.028 (ref 1.005–1.030)
pH: 5 (ref 5.0–8.0)

## 2019-11-30 LAB — RESPIRATORY PANEL BY RT PCR (FLU A&B, COVID)
Influenza A by PCR: NEGATIVE
Influenza B by PCR: NEGATIVE
SARS Coronavirus 2 by RT PCR: NEGATIVE

## 2019-11-30 LAB — EXPECTORATED SPUTUM ASSESSMENT W GRAM STAIN, RFLX TO RESP C

## 2019-11-30 LAB — LACTIC ACID, PLASMA
Lactic Acid, Venous: 2.1 mmol/L (ref 0.5–1.9)
Lactic Acid, Venous: 2 mmol/L (ref 0.5–1.9)
Lactic Acid, Venous: 1.6 mmol/L (ref 0.5–1.9)

## 2019-11-30 LAB — MRSA PCR SCREENING: MRSA by PCR: NEGATIVE

## 2019-11-30 MED ORDER — SODIUM CHLORIDE 0.9 % IV SOLN
500.0000 mg | INTRAVENOUS | Status: DC
  Administered 2019-11-30: 500 mg via INTRAVENOUS
  Filled 2019-11-30: qty 500

## 2019-11-30 MED ORDER — LORAZEPAM 2 MG/ML IJ SOLN: 0.0000 mg | Freq: Two times a day (BID) | INTRAMUSCULAR | Status: DC

## 2019-11-30 MED ORDER — THIAMINE HCL 100 MG/ML IJ SOLN
100.0000 mg | Freq: Every day | INTRAMUSCULAR | Status: DC
  Administered 2019-12-02 – 2019-12-03 (×2): 100 mg via INTRAVENOUS
  Filled 2019-11-30 (×3): qty 2

## 2019-11-30 MED ORDER — LORAZEPAM 2 MG/ML IJ SOLN
0.0000 mg | Freq: Four times a day (QID) | INTRAMUSCULAR | Status: DC
  Administered 2019-12-01: 2 mg via INTRAVENOUS
  Filled 2019-11-30: qty 1

## 2019-11-30 MED ORDER — SODIUM CHLORIDE 0.9 % IV SOLN
2.0000 g | Freq: Two times a day (BID) | INTRAVENOUS | Status: DC
  Administered 2019-11-30 – 2019-12-01 (×3): 2 g via INTRAVENOUS
  Filled 2019-11-30 (×4): qty 2

## 2019-11-30 MED ORDER — LORAZEPAM 1 MG PO TABS: 1.0000 mg | ORAL_TABLET | ORAL | Status: DC | PRN

## 2019-11-30 MED ORDER — LACTATED RINGERS IV SOLN: INTRAVENOUS | Status: DC

## 2019-11-30 MED ORDER — ENOXAPARIN SODIUM 40 MG/0.4ML ~~LOC~~ SOLN
40.0000 mg | SUBCUTANEOUS | Status: DC
  Administered 2019-11-30 – 2019-12-02 (×3): 40 mg via SUBCUTANEOUS
  Filled 2019-11-30 (×3): qty 0.4

## 2019-11-30 MED ORDER — FOLIC ACID 1 MG PO TABS
1.0000 mg | ORAL_TABLET | Freq: Every day | ORAL | Status: DC
  Administered 2019-11-30 – 2019-12-01 (×2): 1 mg via ORAL
  Filled 2019-11-30 (×2): qty 1

## 2019-11-30 MED ORDER — ACETAMINOPHEN 325 MG PO TABS
650.0000 mg | ORAL_TABLET | Freq: Four times a day (QID) | ORAL | Status: DC | PRN
  Administered 2019-11-30 (×2): 650 mg via ORAL
  Filled 2019-11-30 (×2): qty 2

## 2019-11-30 MED ORDER — ADULT MULTIVITAMIN W/MINERALS CH
1.0000 | ORAL_TABLET | Freq: Every day | ORAL | Status: DC
  Administered 2019-11-30 – 2019-12-01 (×2): 1 via ORAL
  Filled 2019-11-30 (×2): qty 1

## 2019-11-30 MED ORDER — SODIUM CHLORIDE 0.9 % IV SOLN
2.0000 g | INTRAVENOUS | Status: DC
  Administered 2019-11-30: 2 g via INTRAVENOUS
  Filled 2019-11-30: qty 20

## 2019-11-30 MED ORDER — NICOTINE 21 MG/24HR TD PT24
21.0000 mg | MEDICATED_PATCH | Freq: Every day | TRANSDERMAL | Status: DC
  Administered 2019-11-30 – 2019-12-03 (×4): 21 mg via TRANSDERMAL
  Filled 2019-11-30 (×4): qty 1

## 2019-11-30 MED ORDER — LORAZEPAM 2 MG/ML IJ SOLN
1.0000 mg | INTRAMUSCULAR | Status: DC | PRN
  Administered 2019-12-01: 4 mg via INTRAVENOUS
  Filled 2019-11-30: qty 2

## 2019-11-30 MED ORDER — PANTOPRAZOLE SODIUM 20 MG PO TBEC: 20.0000 mg | DELAYED_RELEASE_TABLET | Freq: Every day | ORAL | Status: DC

## 2019-11-30 MED ORDER — SODIUM CHLORIDE 0.9 % IV BOLUS (SEPSIS)
1000.0000 mL | Freq: Once | INTRAVENOUS | Status: AC
  Administered 2019-11-30: 1000 mL via INTRAVENOUS

## 2019-11-30 MED ORDER — DM-GUAIFENESIN ER 30-600 MG PO TB12: 1.0000 | ORAL_TABLET | Freq: Two times a day (BID) | ORAL | Status: DC | PRN

## 2019-11-30 MED ORDER — VANCOMYCIN HCL 1500 MG/300ML IV SOLN
1500.0000 mg | Freq: Once | INTRAVENOUS | Status: AC
  Administered 2019-11-30: 1500 mg via INTRAVENOUS
  Filled 2019-11-30 (×2): qty 300

## 2019-11-30 MED ORDER — PANTOPRAZOLE SODIUM 20 MG PO TBEC
20.0000 mg | DELAYED_RELEASE_TABLET | Freq: Every day | ORAL | Status: DC
  Administered 2019-11-30 – 2019-12-01 (×2): 20 mg via ORAL
  Filled 2019-11-30 (×3): qty 1

## 2019-11-30 MED ORDER — SODIUM CHLORIDE 0.9 % IV BOLUS
500.0000 mL | Freq: Once | INTRAVENOUS | Status: AC
  Administered 2019-11-30: 500 mL via INTRAVENOUS

## 2019-11-30 MED ORDER — ALBUTEROL SULFATE HFA 108 (90 BASE) MCG/ACT IN AERS
2.0000 | INHALATION_SPRAY | RESPIRATORY_TRACT | Status: DC | PRN
  Filled 2019-11-30: qty 6.7

## 2019-11-30 MED ORDER — VANCOMYCIN HCL 500 MG/100ML IV SOLN
500.0000 mg | Freq: Two times a day (BID) | INTRAVENOUS | Status: DC
  Filled 2019-11-30 (×2): qty 100

## 2019-11-30 MED ORDER — THIAMINE HCL 100 MG PO TABS
100.0000 mg | ORAL_TABLET | Freq: Every day | ORAL | Status: DC
  Administered 2019-11-30 – 2019-12-01 (×2): 100 mg via ORAL
  Filled 2019-11-30 (×2): qty 1

## 2019-11-30 MED ORDER — HYDROCODONE-ACETAMINOPHEN 5-325 MG PO TABS
1.0000 | ORAL_TABLET | ORAL | Status: DC | PRN
  Administered 2019-11-30 – 2019-12-01 (×2): 1 via ORAL
  Filled 2019-11-30 (×2): qty 1

## 2019-11-30 MED ORDER — METOPROLOL TARTRATE 5 MG/5ML IV SOLN
2.5000 mg | Freq: Once | INTRAVENOUS | Status: AC
  Administered 2019-11-30: 2.5 mg via INTRAVENOUS
  Filled 2019-11-30: qty 5

## 2019-11-30 MED ORDER — ONDANSETRON HCL 4 MG/2ML IJ SOLN
4.0000 mg | Freq: Three times a day (TID) | INTRAMUSCULAR | Status: DC | PRN
  Administered 2019-12-05: 4 mg via INTRAVENOUS
  Filled 2019-11-30: qty 2

## 2019-11-30 NOTE — Plan of Care (Signed)

## 2019-11-30 NOTE — ED Notes (Signed)
Pt sats sustaining below 90. Placed on 2L [place], minimal improvement. Titrated to 4L [place], sats >90. MD notified.

## 2019-11-30 NOTE — ED Triage Notes (Signed)
Arrives to ED today to 'get the medicine' from last night.  patient states he saw the doctor last night, but got tired of waiting for the medicine.

## 2019-11-30 NOTE — ED Notes (Signed)
Pt removing [place], monitoring equipment. Non-compliant with straightening arm for blood pressures.

## 2019-11-30 NOTE — ED Notes (Signed)
Pt provided dinner tray.

## 2019-11-30 NOTE — ED Notes (Signed)
Attempted to call report

## 2019-11-30 NOTE — Progress Notes (Signed)
Yes augmentin would be good

## 2019-11-30 NOTE — Progress Notes (Signed)
Notified bedside nurse of need to  draw blood cultures and give antibiotics.  

## 2019-11-30 NOTE — Consult Note (Signed)
Pharmacy Antibiotic Note  Tyler Holloway is a 59 y.o. male admitted on 11/30/2019 with pneumonia. Pt presenting with cough, fatigue, and weakness. Pt was seen in ED 11/3 with CXR showing left lower lobe pneumonia. PMH includes a history of laryngeal cancer and radiation therapy. Pt was prescribed Augmentin by the cancer facility however it was only prescribed a day or 2 ago so it is unclear if pt has taken any.  Pharmacy has been consulted for vancomycin and cefepime dosing.  Temp 102F, HR 130-145, LA 2.1, WBC 20.4  Plan: Cefepime 2 g q12h Vancomycin 1500 mg IV followed by 500 mg IV every 12 hours.  Goal trough 15-20 mcg/mL. Monitor Scr Follow up MRSA PCR results  Height: 6' 3.5" (191.8 cm) Weight: 60.3 kg (132 lb 15 oz) IBW/kg (Calculated) : 85.65  Temp (24hrs), Avg:100.8 F (38.2 C), Min:99.5 F (37.5 C), Max:102 F (38.9 C)  Recent Labs  Lab 11/29/19 1635 11/30/19 1041 11/30/19 1229  WBC 20.4* 20.4*  --   CREATININE 1.39* 1.33*  --   LATICACIDVEN 1.7 2.1* 2.0*    Estimated Creatinine Clearance: 50.4 mL/min (A) (by C-G formula based on SCr of 1.33 mg/dL (H)).    Allergies  Allergen Reactions  . Pollen Extract Other (See Comments)    Antimicrobials this admission: 11/4 vancomycin >>  11/4 cefepime >>  11/4 azithromycin x 1  11/4 ceftriaxone x 1   Microbiology results: 11/4 BCx: pending 11/4 viral respiratory panel: negative 11/4 Sputum: pending 11/4 MRSA PCR: pending  Thank you for allowing pharmacy to be a part of this patient's care.  Benn Moulder, PharmD Pharmacy Resident  11/30/2019 6:49 PM

## 2019-11-30 NOTE — ED Notes (Signed)
Pt requesting RN to hold off on labs and IV placement. States "I've gotta leave for a hour to go lock my house up." MD notified.

## 2019-11-30 NOTE — ED Provider Notes (Signed)
[facility] Emergency Department Provider Note   ____________________________________________    I have reviewed the triage vital signs and the nursing notes.   HISTORY  Chief Complaint Cough, fatigue, weakness    HPI Tyler Holloway is a 59 y.o. 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  Had x-ray yesterday while in ED waiting room which demonstrated left lower lobe pneumonia, but left without being seen.  Reports he is feeling worse today, per medical record review had been started on Augmentin by cancer facility, unclear if he has taken any of this medication as it was just 1 to 2 days ago.  Past Medical History:  Diagnosis Date  . Cancer ([place])   . Hypertension     Patient Active Problem List   Diagnosis Date Noted  . Sepsis ([place]) 11/30/2019  . CAP (community acquired pneumonia) 11/30/2019  . CKD (chronic kidney disease), stage IIIa 11/30/2019  . Tobacco abuse 11/30/2019  . Alcohol use 11/30/2019  . GERD (gastroesophageal reflux disease) 11/30/2019  . Multifocal pneumonia 08/13/2019  . Goals of care, counseling/discussion 03/16/2019  . 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 HBV (hepatitis B virus) infection 11/04/2015  . Hepatitis B infection without delta agent without hepatic coma 09/11/2015  . HCV antibody positive 04/26/2015  . Peripheral neuropathy 02/14/2015    Past Surgical History:  Procedure Laterality Date  . LARYNGOSCOPY Bilateral 03/02/2019   Procedure: DIRECT LARYNGOSCOPY W/ BX.;  Surgeon: Margaretha Sheffield, MD;  Location: [place];  Service: ENT;  Laterality: Bilateral;  . NO PAST SURGERIES    . RIGID ESOPHAGOSCOPY Bilateral 03/02/2019   Procedure: RIGID ESOPHAGOSCOPY RIGID BRONCOSCOPY;  Surgeon: Margaretha Sheffield, MD;  Location: [place];  Service: ENT;  Laterality: Bilateral;    Prior  to Admission medications   Medication Sig Start Date End Date Taking? Authorizing Provider  dexamethasone (DECADRON) 2 MG tablet Take 1 tablet (2 mg total) by mouth 2 (two) times daily. Patient not taking: Reported on 07/17/2019 06/21/19   Noreene Filbert, MD  HYDROcodone-acetaminophen (NORCO/VICODIN) 5-325 MG tablet Take 1 tablet by mouth every 4 (four) hours as needed for severe pain. 08/22/19   [provider]  pantoprazole (PROTONIX) 20 MG tablet Take 1 tablet (20 mg total) by mouth daily. Patient not taking: Reported on 08/13/2019 07/17/19   Sindy Guadeloupe, MD  sucralfate (CARAFATE) 1 g tablet Take 1 tablet (1 g total) by mouth 3 (three) times daily. Dissolve in 3-4 tbsp warm water, swish and swallow. 10/24/19   Noreene Filbert, MD     Allergies Pollen extract  Family History  Problem Relation Age of Onset  . Diabetes Mellitus I Mother   . Pancreatic cancer Mother   . Stroke Mother   . Heart Problems Sister   . Thyroid disease Sister   . Hypertension Sister   . Cervical cancer Sister     Social History Social History   Tobacco Use  . Smoking status: Current Every Day Smoker    Packs/day: 0.25    Years: 45.00    Pack years: 11.25    Types: Cigarettes  . Smokeless tobacco: Current User  Vaping Use  . Vaping Use: Never used  Substance Use Topics  . Alcohol use: Yes    Alcohol/week: 7.0 standard drinks    Types: 7 Cans of beer per week    Comment: 1/2 pint a day  of liquor  . Drug use: Yes    Types: Marijuana    Comment: Crack    Review of Systems  Constitutional: Positive chills Eyes: No visual changes.  ENT: Chronic throat pain Cardiovascular: Denies chest pain. Respiratory: As above Gastrointestinal: No abdominal pain.    Genitourinary: Negative for dysuria. Musculoskeletal: Negative for calf swelling Skin: Negative for rash. Neurological: Generalized weakness   ____________________________________________   PHYSICAL EXAM:  VITAL SIGNS: ED  Triage Vitals  Enc Vitals Group     BP 11/30/19 1028 138/78     Pulse Rate 11/30/19 1028 (!) 137     Resp 11/30/19 1028 18     Temp 11/30/19 1028 99.5 F (37.5 C)     Temp Source 11/30/19 1028 Oral     SpO2 11/30/19 1028 92 %     Weight 11/30/19 1026 60.3 kg (132 lb 15 oz)     Height 11/30/19 1026 1.918 m (6' 3.5")     Head Circumference --      Peak Flow --      Pain Score 11/30/19 1026 0     Pain Loc --      Pain Edu? --      Excl. in [place]? --     Constitutional: Alert and oriented.  Eyes: Conjunctivae are normal.  Head: Atraumatic. Nose: No congestion/rhinnorhea. Mouth/Throat: Mucous membranes are moist.  Difficult understand due to history of radiation treatment of laryngeal CA Neck:  Painless ROM Cardiovascular: Tachycardia, regular rhythm. Grossly normal heart sounds.  Good peripheral circulation. Respiratory: Increased respiratory effort with tachypnea, no retractions Gastrointestinal: Soft and nontender. No distention.    Musculoskeletal:Warm and well perfused Neurologic:  Normal speech and language. No gross focal neurologic deficits are appreciated.  Skin:  Skin is warm, dry and intact. No rash noted. Psychiatric: Mood and affect are normal.   ____________________________________________   LABS (all labs ordered are listed, but only abnormal results are displayed)  Labs Reviewed  LACTIC ACID, PLASMA - Abnormal; Notable for the following components:      Result Value   Lactic Acid, Venous 2.1 (*)    All other components within normal limits  COMPREHENSIVE METABOLIC PANEL - Abnormal; Notable for the following components:   Sodium 134 (*)    Chloride 94 (*)    Glucose, Bld 134 (*)    BUN 22 (*)    Creatinine, Ser 1.33 (*)    Albumin 2.9 (*)    AST 77 (*)    Total Bilirubin 1.8 (*)    All other components within normal limits  CBC WITH DIFFERENTIAL/PLATELET - Abnormal; Notable for the following components:   WBC 20.4 (*)    RBC 4.08 (*)    Hemoglobin 12.3  (*)    HCT 36.8 (*)    Neutro Abs 18.1 (*)    Lymphs Abs 0.3 (*)    Monocytes Absolute 1.9 (*)    Abs Immature Granulocytes 0.12 (*)    All other components within normal limits  LACTIC ACID, PLASMA - Abnormal; Notable for the following components:   Lactic Acid, Venous 2.0 (*)    All other components within normal limits  CULTURE, BLOOD (SINGLE)  RESPIRATORY PANEL BY RT PCR (FLU A&B, COVID)  EXPECTORATED SPUTUM ASSESSMENT W REFEX TO RESP CULTURE  URINALYSIS, COMPLETE (UACMP) WITH MICROSCOPIC  LACTIC ACID, PLASMA   ____________________________________________  EKG  ED ECG REPORT I, Lavonia Drafts, the attending physician, personally viewed and interpreted this ECG.  Date: 11/30/2019  Rhythm: Sinus tachycardia  QRS Axis: normal Intervals: normal ST/T Wave abnormalities: normal Narrative Interpretation: no evidence of acute ischemia  ____________________________________________  RADIOLOGY  Chest x-ray reviewed by me, left lower lobe pneumonia noted ____________________________________________   PROCEDURES  Procedure(s) performed: No  .1-3 Lead EKG Interpretation Performed by: Lavonia Drafts, MD Authorized by: Lavonia Drafts, MD     Interpretation: normal     ECG rate assessment: tachycardic     Rhythm: sinus rhythm     Ectopy: none     Conduction: normal       Critical Care performed: yes  CRITICAL CARE Performed by: Lavonia Drafts   Total critical care time: 30 minutes  Critical care time was exclusive of separately billable procedures and treating other patients.  Critical care was necessary to treat or prevent imminent or life-threatening deterioration.  Critical care was time spent personally by me on the following activities: development of treatment plan with patient and/or surrogate as well as nursing, discussions with consultants, evaluation of patient's response to treatment, examination of patient, obtaining history from patient or surrogate,  ordering and performing treatments and interventions, ordering and review of laboratory studies, ordering and review of radiographic studies, pulse oximetry and re-evaluation of patient's condition.  ____________________________________________   INITIAL IMPRESSION / ASSESSMENT AND PLAN / ED COURSE  Pertinent labs & imaging results that were available during my care of the patient were reviewed by me and considered in my medical decision making (see chart for details).  Patient with a history of treated laryngeal cancer, still being followed by cancer center presents with cough fatigue weakness, tachycardia.  X-ray consistent with pneumonia.  White blood cell count of 20.5, lactic acid of 2.1.  We will treat patient with IV fluids, IV Rocephin, IV azithromycin  I discussed with the hospitalist for admission   ____________________________________________   FINAL CLINICAL IMPRESSION(S) / ED DIAGNOSES  Final diagnoses:  Community acquired pneumonia of left lower lobe of lung        Note:  This document was prepared using Dragon voice recognition software and may include unintentional dictation errors.   Lavonia Drafts, MD 11/30/19 1355

## 2019-11-30 NOTE — H&P (Addendum)
History and Physical    Tyler Holloway ASN:[id number] DOB: [birth year] DOA: 11/30/2019  Referring MD/NP/PA:   PCP: Patient, No Pcp Per   Patient coming from:  The patient is coming from home.  At baseline, pt is independent for most of ADL.        Chief Complaint: cough and SOB  HPI: Tyler Holloway is a 59 y.o. 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  Pt states that he has been having cough and congestion for several days, which has been progressively worsening. He has mild shortness of breath and mild pleuritic chest pain. Patient has chills and fever with temperature 101 in ED. patient denies nausea vomiting, diarrhea or abdominal pain.  No symptoms of UTI or unilateral weakness. Pt was in ED yesterday, but left hospital due to long waiting time.   ED Course: pt was found to have WBC 20.4, lactic acid 2.1, pending COVID-19 PCR, stable renal function, blood pressure 138/78, tachycardia with heart rate of 137, RR 18, oxygen saturation 92% on room air.  Chest x-ray showed left lower lobe infiltration.  Patient is admitted to [facility] bed as inpatient.  Review of Systems:   General: has fevers, chills, no body weight gain, has fatigue HEENT: no blurry vision, hearing changes or sore throat Respiratory: has dyspnea, coughing, no wheezing CV: has chest pain, no palpitations GI: no nausea, vomiting, abdominal pain, diarrhea, constipation GU: no dysuria, burning on urination, increased urinary frequency, hematuria  Ext: no leg edema Neuro: no unilateral weakness, numbness, or tingling, no vision change or hearing loss Skin: no rash, no skin tear. MSK: No muscle spasm, no deformity, no limitation of range of movement in spin Heme: No easy bruising.  Travel history: No recent long distant travel.  Allergy:   Allergies  Allergen Reactions  . Pollen Extract Other (See Comments)    Past Medical History:  Diagnosis Date  . Cancer ([place])   . Hypertension     Past Surgical History:  Procedure Laterality Date  . LARYNGOSCOPY Bilateral 03/02/2019   Procedure: DIRECT LARYNGOSCOPY W/ BX.;  Surgeon: Margaretha Sheffield, MD;  Location: [place];  Service: ENT;  Laterality: Bilateral;  . NO PAST SURGERIES    . RIGID ESOPHAGOSCOPY Bilateral 03/02/2019   Procedure: RIGID ESOPHAGOSCOPY RIGID BRONCOSCOPY;  Surgeon: Margaretha Sheffield, MD;  Location: [place];  Service: ENT;  Laterality: Bilateral;    Social History:  reports that he has been smoking cigarettes. He has a 11.25 pack-year smoking history. He uses smokeless tobacco. He reports current alcohol use of about 7.0 standard drinks of alcohol per week. He reports current drug use. Drug: Marijuana.  Family History:  Family History  Problem Relation Age of Onset  . Diabetes Mellitus I Mother   . Pancreatic cancer Mother   . Stroke Mother   . Heart Problems Sister   . Thyroid disease Sister   . Hypertension Sister   . Cervical cancer Sister      Prior to Admission medications   Medication Sig Start Date End Date Taking? Authorizing Provider  dexamethasone (DECADRON) 2 MG tablet Take 1 tablet (2 mg total) by mouth 2 (two) times daily. Patient not taking: Reported on 07/17/2019 06/21/19   Noreene Filbert, MD  pantoprazole (PROTONIX) 20 MG tablet Take 1 tablet (20 mg total) by mouth daily. Patient not  taking: Reported on 08/13/2019 07/17/19   Sindy Guadeloupe, MD  sucralfate (CARAFATE) 1 g tablet Take 1 tablet (1 g total) by mouth 3 (three) times daily. Dissolve in 3-4 tbsp warm water, swish and swallow. 10/24/19   Noreene Filbert, MD    Physical Exam: Vitals:   11/30/19 1530 11/30/19 1600 11/30/19 1700 11/30/19 1706  BP:  126/89 (!) 159/84   Pulse: (!) 116 (!) 122 (!) 130 (!) 128  Resp: (!) 28 (!) 35 (!) 37 (!) 33  Temp:      TempSrc:       SpO2: 94% 91%    Weight:      Height:       General: Not in acute distress HEENT:       Eyes: PERRL, EOMI, no scleral icterus.       ENT: No discharge from the ears and nose, no pharynx injection, no tonsillar enlargement.        Neck: No JVD, no bruit, no mass felt. Heme: No neck lymph node enlargement. Cardiac: S1/S2, RRR, No murmurs, No gallops or rubs. Respiratory: Has coarse breathing sound bilaterally GI: Soft, nondistended, nontender, no rebound pain, no organomegaly, BS present. GU: No hematuria Ext: No pitting leg edema bilaterally. 2+DP/PT pulse bilaterally. Musculoskeletal: No joint deformities, No joint redness or warmth, no limitation of ROM in spin. Skin: No rashes.  Neuro: Alert, oriented X3, cranial nerves II-XII grossly intact, moves all extremities normally.  Psych: Patient is not psychotic, no suicidal or hemocidal ideation.  Labs on Admission: I have personally reviewed following labs and imaging studies  CBC: Recent Labs  Lab 11/29/19 1635 11/30/19 1041  WBC 20.4* 20.4*  NEUTROABS 16.0* 18.1*  HGB 12.1* 12.3*  HCT 37.2* 36.8*  MCV 91.6 90.2  PLT 344 893   Basic Metabolic Panel: Recent Labs  Lab 11/29/19 1635 11/30/19 1041  NA 133* 134*  K 3.8 3.6  CL 93* 94*  CO2 28 26  GLUCOSE 125* 134*  BUN 19 22*  CREATININE 1.39* 1.33*  CALCIUM 9.5 9.2   GFR: Estimated Creatinine Clearance: 50.4 mL/min (A) (by C-G formula based on SCr of 1.33 mg/dL (H)). Liver Function Tests: Recent Labs  Lab 11/29/19 1635 11/30/19 1041  AST 68* 77*  ALT 33 40  ALKPHOS 114 107  BILITOT 2.0* 1.8*  PROT 7.9 7.5  ALBUMIN 3.2* 2.9*   No results for input(s): LIPASE, AMYLASE in the last 168 hours. No results for input(s): AMMONIA in the last 168 hours. Coagulation Profile: No results for input(s): INR, PROTIME in the last 168 hours. Cardiac Enzymes: No results for input(s): CKTOTAL, CKMB, CKMBINDEX, TROPONINI in the last 168 hours. BNP (last 3 results) No  results for input(s): PROBNP in the last 8760 hours. HbA1C: No results for input(s): HGBA1C in the last 72 hours. CBG: No results for input(s): GLUCAP in the last 168 hours. Lipid Profile: No results for input(s): CHOL, HDL, LDLCALC, TRIG, CHOLHDL, LDLDIRECT in the last 72 hours. Thyroid Function Tests: No results for input(s): TSH, T4TOTAL, FREET4, T3FREE, THYROIDAB in the last 72 hours. Anemia Panel: No results for input(s): VITAMINB12, FOLATE, FERRITIN, TIBC, IRON, RETICCTPCT in the last 72 hours. Urine analysis:    Component Value Date/Time   APPEARANCEUR Clear 09/11/2015 1020   GLUCOSEU Negative 09/11/2015 1020   BILIRUBINUR Negative 09/11/2015 1020   PROTEINUR Trace 09/11/2015 1020   NITRITE Negative 09/11/2015 1020   LEUKOCYTESUR Negative 09/11/2015 1020   Sepsis Labs: @LABRCNTIP (procalcitonin:4,lacticidven:4) ) Recent Results (from the past  240 hour(s))  Respiratory Panel by RT PCR (Flu A&B, Covid) - Nasopharyngeal Swab     Status: None   Collection Time: 11/30/19 12:48 PM   Specimen: Nasopharyngeal Swab  Result Value Ref Range Status   SARS Coronavirus 2 by RT PCR NEGATIVE NEGATIVE Final    Comment: (NOTE) SARS-CoV-2 target nucleic acids are NOT DETECTED.  The SARS-CoV-2 RNA is generally detectable in upper respiratoy specimens during the acute phase of infection. The lowest concentration of SARS-CoV-2 viral copies this assay can detect is 131 copies/mL. A negative result does not preclude SARS-Cov-2 infection and should not be used as the sole basis for treatment or other patient management decisions. A negative result may occur with  improper specimen collection/handling, submission of specimen other than nasopharyngeal swab, presence of viral mutation(s) within the areas targeted by this assay, and inadequate number of viral copies (<131 copies/mL). A negative result must be combined with clinical observations, patient history, and epidemiological information.  The expected result is Negative.  Fact Sheet for Patients:  PinkCheek.be  Fact Sheet for Healthcare Providers:  GravelBags.it  This test is no t yet approved or cleared by the [place] FDA and  has been authorized for detection and/or diagnosis of SARS-CoV-2 by FDA under an Emergency Use Authorization (EUA). This EUA will remain  in effect (meaning this test can be used) for the duration of the COVID-19 declaration under Section 564(b)(1) of the Act, 21 U.S.C. section 360bbb-3(b)(1), unless the authorization is terminated or revoked sooner.     Influenza A by PCR NEGATIVE NEGATIVE Final   Influenza B by PCR NEGATIVE NEGATIVE Final    Comment: (NOTE) The Xpert Xpress SARS-CoV-2/FLU/RSV assay is intended as an aid in  the diagnosis of influenza from Nasopharyngeal swab specimens and  should not be used as a sole basis for treatment. Nasal washings and  aspirates are unacceptable for Xpert Xpress SARS-CoV-2/FLU/RSV  testing.  Fact Sheet for Patients: PinkCheek.be  Fact Sheet for Healthcare Providers: GravelBags.it  This test is not yet approved or cleared by the [place] FDA and  has been authorized for detection and/or diagnosis of SARS-CoV-2 by  FDA under an Emergency Use Authorization (EUA). This EUA will remain  in effect (meaning this test can be used) for the duration of the  Covid-19 declaration under Section 564(b)(1) of the Act, 21  U.S.C. section 360bbb-3(b)(1), unless the authorization is  terminated or revoked. Performed at [facility], [address] [place], [place] [address]   Expectorated sputum assessment w rflx to resp cult     Status: None   Collection Time: 11/30/19 12:48 PM   Specimen: Sputum  Result Value Ref Range Status   Specimen Description SPU EXPECTORATED SPUTUM  Final   Special Requests NONE  Final   Sputum  evaluation   Final    THIS SPECIMEN IS ACCEPTABLE FOR SPUTUM CULTURE Performed at [facility], [address] [place],  [address]    Report Status 11/30/2019 FINAL  Final     Radiological Exams on Admission: DG Chest 2 View  Result Date: 11/29/2019 CLINICAL DATA:  Wheezing. EXAM: CHEST - 2 VIEW COMPARISON:  None. FINDINGS: The heart size and mediastinal contours are within normal limits. Hyperexpansion of the lungs is noted. No pneumothorax or pleural effusion is noted. Mild right basilar subsegmental atelectasis or scarring is noted. Left lower lobe airspace opacity is noted concerning for pneumonia. The visualized skeletal structures are unremarkable. IMPRESSION: Left lower lobe pneumonia. Mild right  basilar subsegmental atelectasis or scarring. Hyperexpansion of the lungs. Electronically Signed   By: Marijo Conception M.D.   On: 11/29/2019 14:44     EKG: I have personally reviewed.  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  - Will admit to med-surg bed as inpt - IV Rocephin and azithromycin --> switched to vancomycin and cefepime - Mucinex for cough  - Bronchodilators - Urine legionella and S. pneumococcal antigen - Follow up blood culture x2, sputum culture  - will get Procalcitonin and trend lactic acid level per sepsis protocol - IVF: 2.5L of NS bolus in ED, followed by 75 mL per hour of LR  Addendum: His MEW score in up to 7 in late afternoon -will switch  antibiotics to vancomycin and cefepime for broad coverage.  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 Janese Banks of oncology  CKD (chronic kidney disease), stage IIIa: Renal function close to baseline.  Recent baseline creatinine 1.1-1.4.  His creatinine is 1.33, BUN 22 -f/u with BMP  Tobacco abuse and Alcohol abuse: -Nicotine patch -CIWA protocol  GERD: -protonix  HTN: Bp 138/78. Not taking med currently. - monitoring Bp closely     DVT ppx:  SQ Lovenox Code Status: Full code Family Communication:  Yes, patient's Niece by phone Disposition Plan:  Anticipate discharge back to previous environment Consults called:  None Admission status: Med-surg bed as inpt    Status is: Inpatient  Remains inpatient appropriate because:Inpatient level of care appropriate due to severity of illness.  Patient has multiple comorbidities, now presents with sepsis due to CAP.  Has elevated lactic acid level.  His presentation is highly complicated.  Patient is at high risk of deteriorating.  Need to be treated in hospital for at least 2 days.   Dispo: The patient is from: Home              Anticipated d/c is to: Home              Anticipated d/c date is: 2 days              Patient currently is not medically stable to d/c.          Date of Service 11/30/2019    Ivor Costa [facility]   If 7PM-7AM, please contact night-coverage [email or web address] 11/30/2019, 5:12 PM

## 2019-11-30 NOTE — Progress Notes (Signed)
CODE SEPSIS - PHARMACY COMMUNICATION  **Broad Spectrum Antibiotics should be administered within 1 hour of Sepsis diagnosis**  Time Code Sepsis Called/Page Received: 11:44  Antibiotics Ordered: Ceftriaxone and Azithromycin  Time of 1st antibiotic administration: Ceftriaxone given at 12:41  Additional action taken by pharmacy: n/a   If necessary, Name of Provider/Nurse Contacted: n/a    Vira Blanco ,PharmD Clinical Pharmacist  11/30/2019  12:27 PM

## 2019-12-01 ENCOUNTER — Inpatient Hospital Stay: Payer: BC Managed Care – PPO

## 2019-12-01 DIAGNOSIS — E43 Unspecified severe protein-calorie malnutrition: Secondary | ICD-10-CM | POA: Insufficient documentation

## 2019-12-01 LAB — CBC
HCT: 36.2 % — ABNORMAL LOW (ref 39.0–52.0)
Hemoglobin: 11.6 g/dL — ABNORMAL LOW (ref 13.0–17.0)
MCH: 30 pg (ref 26.0–34.0)
MCHC: 32 g/dL (ref 30.0–36.0)
MCV: 93.5 fL (ref 80.0–100.0)
Platelets: 299 10*3/uL (ref 150–400)
RBC: 3.87 MIL/uL — ABNORMAL LOW (ref 4.22–5.81)
RDW: 13.2 % (ref 11.5–15.5)
WBC: 14.7 10*3/uL — ABNORMAL HIGH (ref 4.0–10.5)
nRBC: 0 % (ref 0.0–0.2)

## 2019-12-01 LAB — BLOOD GAS, ARTERIAL
Acid-Base Excess: 3.5 mmol/L — ABNORMAL HIGH (ref 0.0–2.0)
Bicarbonate: 27.8 mmol/L (ref 20.0–28.0)
FIO2: 0.32
O2 Saturation: 92.5 %
Patient temperature: 37
pCO2 arterial: 40 mmHg (ref 32.0–48.0)
pH, Arterial: 7.45 (ref 7.350–7.450)
pO2, Arterial: 62 mmHg — ABNORMAL LOW (ref 83.0–108.0)

## 2019-12-01 LAB — BASIC METABOLIC PANEL
Anion gap: 12 (ref 5–15)
BUN: 22 mg/dL — ABNORMAL HIGH (ref 6–20)
CO2: 24 mmol/L (ref 22–32)
Calcium: 8.9 mg/dL (ref 8.9–10.3)
Chloride: 102 mmol/L (ref 98–111)
Creatinine, Ser: 1.23 mg/dL (ref 0.61–1.24)
GFR, Estimated: >60 mL/min (ref >60)
Glucose, Bld: 100 mg/dL — ABNORMAL HIGH (ref 70–99)
Potassium: 3.9 mmol/L (ref 3.5–5.1)
Sodium: 138 mmol/L (ref 135–145)

## 2019-12-01 LAB — STREP PNEUMONIAE URINARY ANTIGEN: Strep Pneumo Urinary Antigen: NEGATIVE

## 2019-12-01 LAB — HIV ANTIBODY (ROUTINE TESTING W REFLEX): HIV Screen 4th Generation wRfx: NONREACTIVE

## 2019-12-01 MED ORDER — ENSURE ENLIVE PO LIQD: 237.0000 mL | Freq: Three times a day (TID) | ORAL | Status: DC

## 2019-12-01 MED ORDER — SODIUM CHLORIDE 0.9 % IV SOLN
INTRAVENOUS | Status: DC | PRN
  Administered 2019-12-01 – 2019-12-03 (×2): 250 mL via INTRAVENOUS

## 2019-12-01 NOTE — Progress Notes (Signed)
PROGRESS NOTE    Tyler Holloway  WUJ:[id number] DOB: January 17, 1961 DOA: 11/30/2019 PCP: Patient, No Pcp Per     Brief Narrative:  Tyler Holloway is a 59 y.o. 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 Pt states that he has been having cough and congestion for several days, which has been progressively worsening. He has mild shortness of breath and mild pleuritic chest pain. Patient has chills and fever with temperature 101 in ED. Chest x-ray showed left lower lobe infiltration.  New events last 24 hours / Subjective: Sitting in bed about to eat breakfast.  States that his breathing has improved since admission.  Remains on nasal cannula O2 this morning.  Continues to have some cough as well.  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, laryngeal aspect (HCC)   Sepsis ([place])   CKD (chronic kidney disease), stage IIIa   Tobacco abuse   Alcohol use   GERD (gastroesophageal reflux disease)   Hypertension   Severe sepsis secondary to CAP -Sepsis present on admission with fever, tachycardia, tachypnea, respiratory failure -MRSA PCR negative, stop vancomycin -Respiratory culture pending -Blood cultures pending -Continue cefepime  -WBC improving  Acute hypoxemic respiratory failure -Presented with shortness of breath, oxygen saturation 89% on room air requiring nasal cannula O2 -Secondary to above -Continue nasal cannula O2 to maintain oxygen saturation >92%  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 Janese Banks, oncology  CKD stage IIIa -Baseline creatinine 1.1-1.4 -Stable  Alcohol abuse, tobacco abuse -Cessation counseling.  Nicotine patch.  CIWA protocol  for alcohol dependency  GERD -Continue PPI  DVT prophylaxis:  enoxaparin (LOVENOX) injection 40 mg Start: 11/30/19 1830  Code Status: Full code Family Communication: No family at bedside Disposition Plan:  Status is: Inpatient  Remains inpatient appropriate because:IV treatments appropriate due to intensity of illness or inability to take PO and Inpatient level of care appropriate due to severity of illness   Dispo: The patient is from: Home              Anticipated d/c is to: Home              Anticipated d/c date is: 1 day              Patient currently is not medically stable to d/c.  Remains on IV antibiotics.  WBC improving.  Continue to monitor, hopeful discharge home 11/6 if remains improved  Consultants:   None  Procedures:   None  Antimicrobials:  Anti-infectives (From admission, onward)   Start     Dose/Rate Route Frequency Ordered Stop   12/01/19 0800  vancomycin (VANCOREADY) IVPB 500 mg/100 mL  Status:  Discontinued        500 mg 100 mL/hr over 60 Minutes Intravenous Every 12 hours 11/30/19 1841 12/01/19 0736   11/30/19 1930  ceFEPIme (MAXIPIME) 2 g in sodium chloride 0.9 % 100 mL IVPB        2 g 200 mL/hr over 30 Minutes Intravenous Every 12 hours 11/30/19 1841     11/30/19 1930  vancomycin (VANCOREADY) IVPB 1500 mg/300 mL        1,500 mg 150 mL/hr over 120 Minutes Intravenous  Once 11/30/19 1841 12/01/19 0128  11/30/19 1145  cefTRIAXone (ROCEPHIN) 2 g in sodium chloride 0.9 % 100 mL IVPB  Status:  Discontinued        2 g 200 mL/hr over 30 Minutes Intravenous Every 24 hours 11/30/19 1144 11/30/19 1830   11/30/19 1145  azithromycin (ZITHROMAX) 500 mg in sodium chloride 0.9 % 250 mL IVPB  Status:  Discontinued        500 mg 250 mL/hr over 60 Minutes Intravenous Every 24 hours 11/30/19 1144 11/30/19 1830        Objective: Vitals:   12/01/19 0400 12/01/19 0401 12/01/19 0802 12/01/19 1127  BP:  (!) 115/52 114/65 126/85  Pulse:  (!) 105 99 (!) 109    Resp:  18 17 20   Temp:  98.7 F (37.1 C) 98.7 F (37.1 C) 98.1 F (36.7 C)  TempSrc:  Oral Oral   SpO2:  100% 100% 99%  Weight: 57.9 kg     Height: 6' 3.5" (1.918 m)       Intake/Output Summary (Last 24 hours) at 12/01/2019 1230 Last data filed at 12/01/2019 1127 Gross per 24 hour  Intake --  Output 400 ml  Net -400 ml   Filed Weights   11/30/19 1026 12/01/19 0400  Weight: 60.3 kg 57.9 kg    Examination:  General exam: Appears calm and comfortable  Respiratory system: Diminished breath sounds on left.  Respiratory effort normal. No respiratory distress. No conversational dyspnea.  Hoarse voice Cardiovascular system: S1 & S2 heard, RRR. No murmurs. No pedal edema. Gastrointestinal system: Abdomen is nondistended, soft and nontender. Normal bowel sounds heard. Central nervous system: Alert and oriented. No focal neurological deficits. Speech clear.  Extremities: Symmetric in appearance  Skin: No rashes, lesions or ulcers on exposed skin  Psychiatry: Judgement and insight appear normal. Mood & affect appropriate.   Data Reviewed: I have personally reviewed following labs and imaging studies  CBC: Recent Labs  Lab 11/29/19 1635 11/30/19 1041 12/01/19 0514  WBC 20.4* 20.4* 14.7*  NEUTROABS 16.0* 18.1*  --   HGB 12.1* 12.3* 11.6*  HCT 37.2* 36.8* 36.2*  MCV 91.6 90.2 93.5  PLT 344 329 845   Basic Metabolic Panel: Recent Labs  Lab 11/29/19 1635 11/30/19 1041 12/01/19 0514  NA 133* 134* 138  K 3.8 3.6 3.9  CL 93* 94* 102  CO2 28 26 24   GLUCOSE 125* 134* 100*  BUN 19 22* 22*  CREATININE 1.39* 1.33* 1.23  CALCIUM 9.5 9.2 8.9   GFR: Estimated Creatinine Clearance: 52.3 mL/min (by C-G formula based on SCr of 1.23 mg/dL). Liver Function Tests: Recent Labs  Lab 11/29/19 1635 11/30/19 1041  AST 68* 77*  ALT 33 40  ALKPHOS 114 107  BILITOT 2.0* 1.8*  PROT 7.9 7.5  ALBUMIN 3.2* 2.9*   No results for input(s): LIPASE, AMYLASE in the last 168 hours. No  results for input(s): AMMONIA in the last 168 hours. Coagulation Profile: No results for input(s): INR, PROTIME in the last 168 hours. Cardiac Enzymes: No results for input(s): CKTOTAL, CKMB, CKMBINDEX, TROPONINI in the last 168 hours. BNP (last 3 results) No results for input(s): PROBNP in the last 8760 hours. HbA1C: No results for input(s): HGBA1C in the last 72 hours. CBG: No results for input(s): GLUCAP in the last 168 hours. Lipid Profile: No results for input(s): CHOL, HDL, LDLCALC, TRIG, CHOLHDL, LDLDIRECT in the last 72 hours. Thyroid Function Tests: No results for input(s): TSH, T4TOTAL, FREET4, T3FREE, THYROIDAB in the last 72 hours. Anemia Panel: No  results for input(s): VITAMINB12, FOLATE, FERRITIN, TIBC, IRON, RETICCTPCT in the last 72 hours. Sepsis Labs: Recent Labs  Lab 11/29/19 1635 11/30/19 1041 11/30/19 1229 11/30/19 1433  LATICACIDVEN 1.7 2.1* 2.0* 1.6    Recent Results (from the past 240 hour(s))  Culture, blood (single)     Status: None (Preliminary result)   Collection Time: 11/30/19 12:29 PM   Specimen: BLOOD  Result Value Ref Range Status   Specimen Description BLOOD BLOOD LEFT FOREARM  Final   Special Requests   Final    Blood Culture results may not be optimal due to an excessive volume of blood received in culture bottles   Culture   Final    NO GROWTH < 24 HOURS Performed at [facility], [address] [place], [place] [address]    Report Status PENDING  Incomplete  Respiratory Panel by RT PCR (Flu A&B, Covid) - Nasopharyngeal Swab     Status: None   Collection Time: 11/30/19 12:48 PM   Specimen: Nasopharyngeal Swab  Result Value Ref Range Status   SARS Coronavirus 2 by RT PCR NEGATIVE NEGATIVE Final    Comment: (NOTE) SARS-CoV-2 target nucleic acids are NOT DETECTED.  The SARS-CoV-2 RNA is generally detectable in upper respiratoy specimens during the acute phase of infection. The lowest concentration of SARS-CoV-2 viral copies  this assay can detect is 131 copies/mL. A negative result does not preclude SARS-Cov-2 infection and should not be used as the sole basis for treatment or other patient management decisions. A negative result may occur with  improper specimen collection/handling, submission of specimen other than nasopharyngeal swab, presence of viral mutation(s) within the areas targeted by this assay, and inadequate number of viral copies (<131 copies/mL). A negative result must be combined with clinical observations, patient history, and epidemiological information. The expected result is Negative.  Fact Sheet for Patients:  PinkCheek.be  Fact Sheet for Healthcare Providers:  GravelBags.it  This test is no t yet approved or cleared by the [place] FDA and  has been authorized for detection and/or diagnosis of SARS-CoV-2 by FDA under an Emergency Use Authorization (EUA). This EUA will remain  in effect (meaning this test can be used) for the duration of the COVID-19 declaration under Section 564(b)(1) of the Act, 21 U.S.C. section 360bbb-3(b)(1), unless the authorization is terminated or revoked sooner.     Influenza A by PCR NEGATIVE NEGATIVE Final   Influenza B by PCR NEGATIVE NEGATIVE Final    Comment: (NOTE) The Xpert Xpress SARS-CoV-2/FLU/RSV assay is intended as an aid in  the diagnosis of influenza from Nasopharyngeal swab specimens and  should not be used as a sole basis for treatment. Nasal washings and  aspirates are unacceptable for Xpert Xpress SARS-CoV-2/FLU/RSV  testing.  Fact Sheet for Patients: PinkCheek.be  Fact Sheet for Healthcare Providers: GravelBags.it  This test is not yet approved or cleared by the [place] FDA and  has been authorized for detection and/or diagnosis of SARS-CoV-2 by  FDA under an Emergency Use Authorization (EUA). This EUA  will remain  in effect (meaning this test can be used) for the duration of the  Covid-19 declaration under Section 564(b)(1) of the Act, 21  U.S.C. section 360bbb-3(b)(1), unless the authorization is  terminated or revoked. Performed at [facility], [address] [place], [place] [address]   Expectorated sputum assessment w rflx to resp cult     Status: None   Collection Time: 11/30/19 12:48 PM   Specimen: Sputum  Result Value Ref Range Status   Specimen Description SPU EXPECTORATED SPUTUM  Final   Special Requests NONE  Final   Sputum evaluation   Final    THIS SPECIMEN IS ACCEPTABLE FOR SPUTUM CULTURE Performed at [facility], [address] [place], [place] [address]    Report Status 11/30/2019 FINAL  Final  Culture, respiratory     Status: None (Preliminary result)   Collection Time: 11/30/19 12:48 PM   Specimen: Sputum  Result Value Ref Range Status   Specimen Description   Final    SPU EXPECTORATED SPUTUM Performed at [facility], [address] [place], [place] [address]    Special Requests   Final    NONE Reflexed from [id number] Performed at [address] [place], [place] [address]    Gram Stain   Final    ABUNDANT WBC PRESENT,BOTH PMN AND MONONUCLEAR ABUNDANT GRAM POSITIVE COCCI ABUNDANT GRAM NEGATIVE RODS MODERATE GRAM POSITIVE RODS RARE YEAST    Culture   Final    TOO YOUNG TO READ Performed at [facility], [place] [address] [place], [place] [address]    Report Status PENDING  Incomplete  MRSA PCR Screening     Status: None   Collection Time: 11/30/19 10:15 PM   Specimen: Nasopharyngeal  Result Value Ref Range Status   MRSA by PCR NEGATIVE NEGATIVE Final    Comment:        The GeneXpert MRSA Assay (FDA approved for NASAL specimens only), is one component of a comprehensive MRSA colonization surveillance program. It is not intended to diagnose MRSA infection nor to guide or monitor  treatment for MRSA infections. Performed at [facility], [address] [place], [place] [address]       Radiology Studies: DG Chest 2 View  Result Date: 11/29/2019 CLINICAL DATA:  Wheezing. EXAM: CHEST - 2 VIEW COMPARISON:  None. FINDINGS: The heart size and mediastinal contours are within normal limits. Hyperexpansion of the lungs is noted. No pneumothorax or pleural effusion is noted. Mild right basilar subsegmental atelectasis or scarring is noted. Left lower lobe airspace opacity is noted concerning for pneumonia. The visualized skeletal structures are unremarkable. IMPRESSION: Left lower lobe pneumonia. Mild right basilar subsegmental atelectasis or scarring. Hyperexpansion of the lungs. Electronically Signed   By: Marijo Conception M.D.   On: 11/29/2019 14:44      Scheduled Meds: . enoxaparin (LOVENOX) injection  40 mg Subcutaneous Q24H  . folic acid  1 mg Oral Daily  . LORazepam  0-4 mg Intravenous Q6H   Followed by  . [START ON 12/02/2019] LORazepam  0-4 mg Intravenous Q12H  . multivitamin with minerals  1 tablet Oral Daily  . nicotine  21 mg Transdermal Daily  . pantoprazole  20 mg Oral Daily  . thiamine  100 mg Oral Daily   Or  . thiamine  100 mg Intravenous Daily   Continuous Infusions: . ceFEPime (MAXIPIME) IV 2 g (12/01/19 1102)     LOS: 1 day      Time spent: 35 minutes   Dessa Phi, DO [facility] 12/01/2019, 12:30 PM   Available via Epic secure chat 7am-7pm After these hours, please refer to coverage provider listed on [email or web address]

## 2019-12-01 NOTE — Consult Note (Signed)
Pharmacy Antibiotic Note  Tyler Holloway is a 59 y.o. male admitted on 11/30/2019 with pneumonia. Pt presenting with cough, fatigue, and weakness. Pt was seen in ED 11/3 with CXR showing left lower lobe pneumonia. PMH includes a history of laryngeal cancer and radiation therapy. Pt was prescribed Augmentin by the cancer facility however it was only prescribed a day or 2 ago so it is unclear if pt has taken any.  Pharmacy has been consulted for cefepime dosing.  Plan: Cefepime 2 g q12h per renal function.   Vancomycin d/c'ed   Height: 6' 3.5" (191.8 cm) Weight: 57.9 kg (127 lb 10.3 oz) IBW/kg (Calculated) : 85.65  Temp (24hrs), Avg:99.2 F (37.3 C), Min:98.1 F (36.7 C), Max:102 F (38.9 C)  Recent Labs  Lab 11/29/19 1635 11/30/19 1041 11/30/19 1229 11/30/19 1433 12/01/19 0514  WBC 20.4* 20.4*  --   --  14.7*  CREATININE 1.39* 1.33*  --   --  1.23  LATICACIDVEN 1.7 2.1* 2.0* 1.6  --     Estimated Creatinine Clearance: 52.3 mL/min (by C-G formula based on SCr of 1.23 mg/dL).    Allergies  Allergen Reactions   Pollen Extract Other (See Comments)    Antimicrobials this admission: 11/4 vancomycin >>  11/4 cefepime >>  11/4 azithromycin x 1  11/4 ceftriaxone x 1   Microbiology results: 11/4 BCx: pending 11/4 viral respiratory panel: negative 11/4 Sputum: pending 11/4 MRSA PCR: negative.   Thank you for allowing pharmacy to be a part of this patients care.  Oswald Hillock, PharmD 12/01/2019 2:13 PM

## 2019-12-01 NOTE — Progress Notes (Signed)
Initial Nutrition Assessment  DOCUMENTATION CODES:   Severe malnutrition in context of chronic illness  INTERVENTION:   Ensure Enlive po TID, each supplement provides 350 kcal and 20 grams of protein  Magic cup TID with meals, each supplement provides 290 kcal and 9 grams of protein  MVI daily   Liberalize diet   Pt at high refeed risk; recommend monitor potassium, magnesium and phosphorus labs daily as oral intake improves.   NUTRITION DIAGNOSIS:   Severe Malnutrition related to cancer and cancer related treatments as evidenced by 22 percent weight loss in 9 months, severe fat depletion, severe muscle depletion.  GOAL:   Patient will meet greater than or equal to 90% of their needs  MONITOR:   PO intake, Supplement acceptance, Labs, Weight trends, Skin, I & O's  REASON FOR ASSESSMENT:   Other (Comment) (Low BMI)    ASSESSMENT:   59 y.o. male with medical history significant of squamous cell carcinoma of supraglottic larynx stage II T2 N0 M0 (s/p radiation), HTN, GERD, HBV, HCV, tobacco abuse, alcohol use and CKD-3 who is admitted with CAP   Met with pt in room today. Pt reports poor appetite and oral intake for several months pta. Pt reports that his appetite continues to be poor in hospital. Pt's breakfast tray was sitting on his side table with only bites taken from it. Pt reports that he has been drinking Ensure supplements at home; pt prefers strawberry and chocolate flavors. Pt reports a 50lb weight loss over the past year. Per chart, pt is down 36lbs(22%) in 9 months; this is significant weight loss. It appears the majority of pt's weight loss was from January to May; Pt has remained fairly weight stable since May. RD will add supplements and MVI to help pt meet his estimated needs. RD will also liberalize pt's diet. Pt is likely at high refeed risk. Recommended pt to drink three Ensure per day along with eating high calorie and protein foods such as milkshakes. Pt may  benefit from PEG tube placement to assist him with regaining his weight; RD unsure if pt would want this as it does not appear this has been discussed with him in the past.   Medications reviewed and include: lovenox, folic acid, MVI, nicotine, protonix, thiamine, cefepime   Labs reviewed: BUN 22(H) Wbc- 14.7(H)  NUTRITION - FOCUSED PHYSICAL EXAM:    Most Recent Value  Orbital Region Severe depletion  Upper Arm Region Severe depletion  Thoracic and Lumbar Region Severe depletion  Buccal Region Severe depletion  Temple Region Severe depletion  Clavicle Bone Region Severe depletion  Clavicle and Acromion Bone Region Severe depletion  Scapular Bone Region Severe depletion  Dorsal Hand Severe depletion  Patellar Region Severe depletion  Anterior Thigh Region Severe depletion  Posterior Calf Region Severe depletion  Edema (RD Assessment) None  Hair Reviewed  Eyes Reviewed  Mouth Reviewed  Skin Reviewed  Nails Reviewed     Diet Order:   Diet Order            Diet regular Room service appropriate? Yes; Fluid consistency: Thin  Diet effective now                EDUCATION NEEDS:   Education needs have been addressed  Skin:  Skin Assessment: Reviewed RN Assessment  Last BM:  11/3  Height:   Ht Readings from Last 1 Encounters:  12/01/19 6' 3.5" (1.918 m)    Weight:   Wt Readings from Last 1 Encounters:  12/01/19 57.9 kg    Ideal Body Weight:  91.8 kg  BMI:  Body mass index is 15.74 kg/m.  Estimated Nutritional Needs:   Kcal:  2100-2400kcal/day  Protein:  105-115g/day  Fluid:  1.7-2.0L/day  Koleen Distance MS, RD, LDN Please refer to [facility] for RD and/or RD on-call/weekend/after hours pager

## 2019-12-02 LAB — CBC
HCT: 34.9 % — ABNORMAL LOW (ref 39.0–52.0)
Hemoglobin: 11.4 g/dL — ABNORMAL LOW (ref 13.0–17.0)
MCH: 29.8 pg (ref 26.0–34.0)
MCHC: 32.7 g/dL (ref 30.0–36.0)
MCV: 91.4 fL (ref 80.0–100.0)
Platelets: 326 10*3/uL (ref 150–400)
RBC: 3.82 MIL/uL — ABNORMAL LOW (ref 4.22–5.81)
RDW: 13.2 % (ref 11.5–15.5)
WBC: 13.3 10*3/uL — ABNORMAL HIGH (ref 4.0–10.5)
nRBC: 0 % (ref 0.0–0.2)

## 2019-12-02 LAB — BASIC METABOLIC PANEL
Anion gap: 11 (ref 5–15)
BUN: 23 mg/dL — ABNORMAL HIGH (ref 6–20)
CO2: 24 mmol/L (ref 22–32)
Calcium: 9.3 mg/dL (ref 8.9–10.3)
Chloride: 103 mmol/L (ref 98–111)
Creatinine, Ser: 0.88 mg/dL (ref 0.61–1.24)
GFR, Estimated: >60 mL/min (ref >60)
Glucose, Bld: 103 mg/dL — ABNORMAL HIGH (ref 70–99)
Potassium: 3.8 mmol/L (ref 3.5–5.1)
Sodium: 138 mmol/L (ref 135–145)

## 2019-12-02 LAB — CULTURE, RESPIRATORY W GRAM STAIN: Culture: NORMAL

## 2019-12-02 MED ORDER — METRONIDAZOLE IN NACL 5-0.79 MG/ML-% IV SOLN
500.0000 mg | Freq: Three times a day (TID) | INTRAVENOUS | Status: DC
  Administered 2019-12-02: 500 mg via INTRAVENOUS
  Filled 2019-12-02 (×2): qty 100

## 2019-12-02 MED ORDER — LORAZEPAM 2 MG/ML IJ SOLN
1.5000 mg | Freq: Once | INTRAMUSCULAR | Status: AC
  Administered 2019-12-02: 1.5 mg via INTRAVENOUS

## 2019-12-02 MED ORDER — SODIUM CHLORIDE 0.9 % IV SOLN
1.0000 g | Freq: Three times a day (TID) | INTRAVENOUS | Status: DC
  Administered 2019-12-02: 1 g via INTRAVENOUS
  Filled 2019-12-02 (×3): qty 1

## 2019-12-02 MED ORDER — METOPROLOL TARTRATE 5 MG/5ML IV SOLN
2.5000 mg | Freq: Once | INTRAVENOUS | Status: AC
  Administered 2019-12-02: 2.5 mg via INTRAVENOUS
  Filled 2019-12-02: qty 5

## 2019-12-02 MED ORDER — LABETALOL HCL 5 MG/ML IV SOLN
5.0000 mg | INTRAVENOUS | Status: DC | PRN
  Administered 2019-12-02 (×2): 5 mg via INTRAVENOUS
  Filled 2019-12-02 (×3): qty 4

## 2019-12-02 MED ORDER — LORAZEPAM 2 MG/ML IJ SOLN
0.5000 mg | Freq: Once | INTRAMUSCULAR | Status: AC
  Administered 2019-12-02: 0.5 mg via INTRAVENOUS
  Filled 2019-12-02: qty 1

## 2019-12-02 MED ORDER — LORAZEPAM 2 MG/ML IJ SOLN
1.0000 mg | INTRAMUSCULAR | Status: DC | PRN
  Administered 2019-12-03 (×2): 2 mg via INTRAVENOUS
  Administered 2019-12-03: 1 mg via INTRAVENOUS
  Filled 2019-12-02 (×3): qty 1

## 2019-12-02 MED ORDER — SODIUM CHLORIDE 0.9 % IV SOLN
2.0000 g | Freq: Three times a day (TID) | INTRAVENOUS | Status: DC
  Administered 2019-12-02 – 2019-12-03 (×4): 2 g via INTRAVENOUS
  Filled 2019-12-02 (×6): qty 2

## 2019-12-02 MED ORDER — HALOPERIDOL LACTATE 5 MG/ML IJ SOLN
2.0000 mg | Freq: Four times a day (QID) | INTRAMUSCULAR | Status: DC | PRN
  Administered 2019-12-02: 2 mg via INTRAVENOUS
  Filled 2019-12-02: qty 1

## 2019-12-02 MED ORDER — LORAZEPAM 2 MG/ML IJ SOLN
INTRAMUSCULAR | Status: AC
  Filled 2019-12-02: qty 1

## 2019-12-02 MED ORDER — LORAZEPAM 1 MG PO TABS: 1.0000 mg | ORAL_TABLET | ORAL | Status: DC | PRN

## 2019-12-02 NOTE — Evaluation (Signed)
Physical Therapy Evaluation Patient Details Name: Tyler Holloway MRN: [id number] DOB: [birth year] Today's Date: 12/02/2019   History of Present Illness  Tyler Holloway is a 59 y.o. 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   Clinical Impression  Pt received sitting edge of bed with nurse present in room. RN requested that clinician not attempt to put pt in chair at this time. Attempted to engage pt in conversation however pt adamant about getting up and going to the store. Pt unable to be redirected. Pt noted to not have on non-skid socks while attempting to stand. Second clinician arrived with socks and remained for the rest of the session. Pt continued to attempt to get out of bed, self-initiating standing however unable to come into full upright stance secondary to weakness and pt with poor balance requiring min-mod+2 A for safety and balance. Pt assisted back to sitting with pt attempting to stand several more times. Pt very agitated, began swinging and cussing at clinicians. Further mobility/activities deferred due to safety concerns. RN arrived to administer medication and pt required assistance to bring BLE back onto bed. Pt alert to self only therefore unable to obtain history about PLOF and no family present to clarify. Pt presents with deficits in safety awareness, cognition, strength, balance, and functional mobility requiring +2 assistance for safety. Pt would benefit from skilled therapy this hospital stay to address aforementioned deficits. Recommend STR with 24 hour supervision to maximize safety of patient and independence with functional mobility.     Follow Up Recommendations SNF;Supervision/Assistance - 24 hour    Equipment Recommendations  Other (comment) (TBD with pt progression)     Recommendations for Other Services       Precautions / Restrictions Precautions Precautions: Fall Restrictions Weight Bearing Restrictions: No      Mobility  Bed Mobility Overal bed mobility: Needs Assistance Bed Mobility: Sit to Supine       Sit to supine: Max assist   General bed mobility comments: MAx A for BLE elevation back into bed due to patient agitation    Transfers Overall transfer level: Needs assistance Equipment used: None Transfers: Sit to/from Stand Sit to Stand: Min assist;+2 physical assistance         General transfer comment: pt self initiated sit to stand however with poor balance and weak BLE requiring min-mod+2 A for safety and controlled sitting  Ambulation/Gait             General Gait Details: not safe to attempt  Stairs            Wheelchair Mobility    Modified Rankin (Stroke Patients Only)       Balance Overall balance assessment: Needs assistance Sitting-balance support: No upper extremity supported;Feet supported Sitting balance-Leahy Scale: Fair Sitting balance - Comments: pt reaching in and out of BOS; supervision for safety as pt agitation and disoriented   Standing balance support: No upper extremity supported Standing balance-Leahy Scale: Poor Standing balance comment: min-mod+2 A for balance as pt attempting to stand/ambulate with poor balance                             Pertinent Vitals/Pain Pain Assessment: No/denies pain    Home Living Family/patient expects to be discharged to:: Unsure  Additional Comments: Pt unable to answer     Prior Function           Comments: Pt unable to provide     Hand Dominance        Extremity/Trunk Assessment   Upper Extremity Assessment Upper Extremity Assessment: Generalized weakness;Difficult to assess due to impaired cognition    Lower Extremity Assessment Lower Extremity Assessment: Generalized weakness;Difficult  to assess due to impaired cognition       Communication   Communication: Other (comment) (receptive difficulties due to agitation)  Cognition Arousal/Alertness: Awake/alert Behavior During Therapy: Agitated;Restless;Impulsive Overall Cognitive Status: No family/caregiver present to determine baseline cognitive functioning Area of Impairment: Orientation;Memory;Following commands;Safety/judgement;Problem solving                 Orientation Level: Disoriented to;Place;Time;Situation   Memory: Decreased recall of precautions;Decreased short-term memory Following Commands: Follows one step commands inconsistently Safety/Judgement: Decreased awareness of safety;Decreased awareness of deficits   Problem Solving: Slow processing;Decreased initiation;Difficulty sequencing General Comments: Pt able to state name and DOB however [place] the mall. Pt received sitting edge of bed and trying to get up with all 4 bedrails raised. Pt without grippy socks on and seizure cushion on floor. Pt attempting to get up and "go to the store" and became agitated when trying to redirect him in which he began to swing his arms at clinicians.      General Comments      Exercises Other Exercises Other Exercises: Pt educated re: OT role, re-oriented to place/time, falls prevention Other Exercises: UBD, sit<>stand, sup>sit, sitting/standing balance/tolerance Other Exercises: +2 assistance for pt safety as he became agitated and began swinging at clinicians; nurse arrived to administer medication for agitation   Assessment/Plan    PT Assessment Patient needs continued PT services  PT Problem List Decreased strength;Decreased activity tolerance;Decreased balance;Decreased mobility;Decreased cognition;Decreased safety awareness       PT Treatment Interventions DME instruction;Gait training;Functional mobility training;Therapeutic activities;Therapeutic exercise;Balance training;Cognitive  remediation;Patient/family education;Stair training    PT Goals (Current goals can be found in the Care Plan section)  Acute Rehab PT Goals Patient Stated Goal: to go to the store PT Goal Formulation: Patient unable to participate in goal setting Time For Goal Achievement: 12/16/19 Potential to Achieve Goals: Fair    Frequency Min 2X/week   Barriers to discharge        Co-evaluation               AM-PAC PT "6 Clicks" Mobility  Outcome Measure Help needed turning from your back to your side while in a flat bed without using bedrails?: None Help needed moving from lying on your back to sitting on the side of a flat bed without using bedrails?: A Little Help needed moving to and from a bed to a chair (including a wheelchair)?: A Lot Help needed standing up from a chair using your arms (e.g., wheelchair or bedside chair)?: A Little Help needed to walk in hospital room?: A Lot Help needed climbing 3-5 steps with a railing? : A Lot 6 Click Score: 16    End of Session Equipment Utilized During Treatment: Oxygen Activity Tolerance: Treatment limited secondary to agitation Patient left: in bed;with call bell/phone within reach;with bed alarm set;with nursing/sitter in room Nurse Communication: Mobility status PT Visit Diagnosis: Unsteadiness on feet (R26.81);Other abnormalities of gait and mobility (R26.89);Muscle weakness (generalized) (M62.81)    Time: 6010-9323 PT Time Calculation (min) (ACUTE ONLY): 24 min   Charges:  Vale Haven, SPT  Virat Prather 12/02/2019, 3:17 PM

## 2019-12-02 NOTE — Evaluation (Signed)
Occupational Therapy Evaluation Patient Details Name: Tyler Holloway MRN: [id number] DOB: 07-16-1960 Today's Date: 12/02/2019    History of Present Illness Tyler Holloway is a 59 y.o. 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    Clinical Impression   Tyler Holloway was seen for OT evaluation this date. Pt oriented to self only - unable to provide home setup or PLOF. Pt presents to acute OT demonstrating impaired ADL performance, functional cognition, and functional mobility 2/2 decreased safety awareness, functional strength/balance deficits, decreased activity tolerance, poor insight into deficits. Pt received sitting EOB gown removed - perserverative on finding his shirt. When handed a gown pt uses expletives to state he does not want that. Cotton gown obtained and pt continuously attempting to don blanket as a shirt - requires MOD VCs to assist donning gown (pt has ROM and strength necessary but unable to sequence task). Pt tells OT "you have my shirt, I left it at your house last night go get it." Pt currently requires MOD A sit<>stand -  unable to achieve upright posture, B knees locked against bed; unable to progress to taking steps (unclear cognition vs strength limiting). Pt would benefit from skilled OT to address noted impairments and functional limitations (see below for any additional details) in order to maximize safety and independence while minimizing falls risk and caregiver burden. Upon hospital discharge, recommend STR to maximize pt safety and return to PLOF.     Follow Up Recommendations  SNF    Equipment Recommendations  Other (comment) (TBD)    Recommendations for Other Services       Precautions / Restrictions Precautions Precautions: Fall Restrictions Weight Bearing Restrictions: No       Mobility Bed Mobility Overal bed mobility: Needs Assistance Bed Mobility: Sit to Supine       Sit to supine: Supervision   General bed mobility comments: SUP for safety     Transfers Overall transfer level: Needs assistance Equipment used: 1 person hand held assist Transfers: Sit to/from Stand Sit to Stand: Mod assist         General transfer comment: unable to achieve upright posture, B knees locked against bed    Balance Overall balance assessment: Needs assistance Sitting-balance support: No upper extremity supported;Feet supported Sitting balance-Leahy Scale: Good     Standing balance support: No upper extremity supported Standing balance-Leahy Scale: Poor      ADL either performed or assessed with clinical judgement   ADL Overall ADL's : Needs assistance/impaired      General ADL Comments: MOD A don gown seated EOB - assist for sequencing. MOD A for ADL t/f - pt unable to progress to taking steps (unclear cognition vs strength limiting).                   Pertinent Vitals/Pain Pain Assessment: No/denies pain     Hand Dominance     Extremity/Trunk Assessment Upper Extremity Assessment Upper Extremity Assessment: Generalized weakness   Lower Extremity Assessment Lower Extremity Assessment: Generalized weakness       Communication Communication Communication: Receptive difficulties   Cognition Arousal/Alertness: Awake/alert Behavior During Therapy: Restless Overall Cognitive Status: No family/caregiver present to determine baseline cognitive functioning Area of Impairment: Orientation;Memory;Following commands;Safety/judgement;Problem solving        Orientation Level: Disoriented to;Place;Time;Situation   Memory: Decreased recall  of precautions;Decreased short-term memory Following Commands: Follows one step commands inconsistently Safety/Judgement: Decreased awareness of safety;Decreased awareness of deficits   Problem Solving: Slow  processing;Decreased initiation;Difficulty sequencing General Comments: Pt received naked sitting EOB - perserverative on finding his shirt. When handed a gown pt uses expletives to state he does not want that. Cotton gown obtained and pt continuously attempting to don blanket as a shirt - requires step by step sequencing to assist. Pt tells OT "you have my shirt, I left it there last night go get it"    General Comments       Exercises Exercises: Other exercises Other Exercises Other Exercises: Pt educated re: OT role, re-oriented to place/time, falls prevention Other Exercises: UBD, sit<>stand, sup>sit, sitting/standing balance/tolerance   Shoulder Instructions      Home Living Family/patient expects to be discharged to:: Unsure              Additional Comments: Pt unable to answer       Prior Functioning/Environment          Comments: Pt unable to provide        OT Problem List: Decreased strength;Decreased activity tolerance;Impaired balance (sitting and/or standing);Decreased cognition;Decreased safety awareness      OT Treatment/Interventions: Self-care/ADL training;Therapeutic exercise;Energy conservation;DME and/or AE instruction;Therapeutic activities;Patient/family education;Balance training    OT Goals(Current goals can be found in the care plan section) Acute Rehab OT Goals Patient Stated Goal: To return home OT Goal Formulation: With patient Time For Goal Achievement: 12/16/19 Potential to Achieve Goals: Good ADL Goals Pt Will Perform Grooming: with modified independence;sitting Pt Will Perform Lower Body Dressing: sit to/from stand;with min guard assist Pt Will Transfer to Toilet: with min guard assist;ambulating;bedside commode (c LRAD PRN)  OT Frequency: Min 1X/week   Barriers to D/C: Inaccessible home environment;Decreased caregiver support             AM-PAC OT "6 Clicks" Daily Activity     Outcome Measure Help from another person eating  meals?: A Little Help from another person taking care of personal grooming?: A Little Help from another person toileting, which includes using toliet, bedpan, or urinal?: A Lot Help from another person bathing (including washing, rinsing, drying)?: A Lot Help from another person to put on and taking off regular upper body clothing?: A Little Help from another person to put on and taking off regular lower body clothing?: A Lot 6 Click Score: 15   End of Session Nurse Communication: Mobility status  Activity Tolerance: Other (comment) (limited by cognition) Patient left: in bed;with call bell/phone within reach;with bed alarm set  OT Visit Diagnosis: Other abnormalities of gait and mobility (R26.89);Muscle weakness (generalized) (M62.81)                Time: 0051-1021 OT Time Calculation (min): 14 min Charges:  OT General Charges $OT Visit: 1 Visit OT Evaluation $OT Eval Low Complexity: 1 Low OT Treatments $Self Care/Home Management : 8-22 mins  Dessie Coma, M.S. OTR/L  12/02/19, 1:16 PM  ascom [phone]

## 2019-12-02 NOTE — Progress Notes (Signed)
Patient becoming more agitated., attempting to get out of bed multiple times, pulling off telemetry. Patient states, he wants a beer, needs to go to the store. Currently nothing ordered, made dr. Maylene Roes aware im running out of options. Currently unable to get sitter.

## 2019-12-02 NOTE — Progress Notes (Signed)
  PROGRESS NOTE  Notified by RN that patient continued to get more agitated.  Patient was evaluated at bedside, niece just got to the hospital.  Patient is attempting to get out of bed, unable to be redirected, states that he wants to go to the store to get a beer.  Per niece, patient drinks "a lot" of beer on a daily basis.  We discussed that patient is going through alcohol withdrawal and will have to be managed with CIWA protocol, IV Ativan.  Discussed with bedside RN.  Have also ordered one-time dose of IV Haldol for agitation as needed.    Continue CIWA for alcohol withdrawal. Sitter ordered, however if sitter is not available then family may stay outside of visitor hours for patient safety. Currently does not appear to be in respiratory distress, monitor closely for decompensation.  I suspect patient may not tolerate BiPAP.   Dessa Phi, DO [facility] 12/02/2019, 2:50 PM  Available via Epic secure chat 7am-7pm After these hours, please refer to coverage provider listed on [email or web address]

## 2019-12-02 NOTE — Progress Notes (Signed)
Pharmacy Antibiotic Note  Tyler Holloway is a 59 y.o. male admitted on 11/30/2019 with pneumonia.  Pharmacy has been consulted for Meropenem dosing.  CrCl = 52.3 ml/min   Plan: Meropenem 1 gm IV Q8H ordered to start on 11/6 @ 0400.   Height: 6' 3.5" (191.8 cm) Weight: 57.9 kg (127 lb 10.3 oz) IBW/kg (Calculated) : 85.65  Temp (24hrs), Avg:98.4 F (36.9 C), Min:97.9 F (36.6 C), Max:98.7 F (37.1 C)  Recent Labs  Lab 11/29/19 1635 11/30/19 1041 11/30/19 1229 11/30/19 1433 12/01/19 0514  WBC 20.4* 20.4*  --   --  14.7*  CREATININE 1.39* 1.33*  --   --  1.23  LATICACIDVEN 1.7 2.1* 2.0* 1.6  --     Estimated Creatinine Clearance: 52.3 mL/min (by C-G formula based on SCr of 1.23 mg/dL).    Allergies  Allergen Reactions   Pollen Extract Other (See Comments)    Antimicrobials this admission:   >>    >>   Dose adjustments this admission:   Microbiology results:  BCx:   UCx:    Sputum:    MRSA PCR:   Thank you for allowing pharmacy to be a part of this patients care.  Niklaus Mamaril D 12/02/2019 3:55 AM

## 2019-12-02 NOTE — Progress Notes (Signed)
Bedside swalow eval completed. Report to follow. Pt at high risk for aspiration, needs to be NPO except occasional ice chips for pleasure. Spoke to Nsg, secure chat sent to MD.

## 2019-12-02 NOTE — Progress Notes (Signed)
PROGRESS NOTE    Tyler Holloway  JSE:[id number] DOB: 12/07/1960 DOA: 11/30/2019 PCP: Patient, No Pcp Per     Brief Narrative:  Tyler Holloway is a 59 y.o. 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 Pt states that he has been having cough and congestion for several days, which has been progressively worsening. He has mild shortness of breath and mild pleuritic chest pain. Patient has chills and fever with temperature 101 in ED. Chest x-ray showed left lower lobe infiltration.  New events last 24 hours / Subjective: He had taken off his nasal cannula O2 this morning.  Oxygen saturation 97% on room air.  He states that his breathing has improved, although not quite back to baseline.  Has been coughing as well.  After my evaluation, I was notified by speech therapy that patient remains at high risk for aspiration.  He was made n.p.o.  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, laryngeal aspect ([place])   Sepsis ([place])   CKD (chronic kidney disease), stage IIIa   Tobacco abuse   Alcohol use   GERD (gastroesophageal reflux disease)   Hypertension   Protein-calorie malnutrition, severe   Severe sepsis secondary to aspiration pneumonia  -Sepsis present on admission with fever, tachycardia, tachypnea, respiratory failure -MRSA PCR negative, stop vancomycin -Respiratory culture: normal respiratory flora-no Staph aureus or Pseudomonas seen  -Blood cultures negative to date -Continue cefepime (was changed to merrem overnight, but respiratory culture revealed normal flora, no staph or pseudomonas seen)  -WBC improving  Dysphagia -Per SLP patient remains at high risk of aspiration. Made NPO. Will need to  address alternate means of nutrition.  Planning for MBS.  Will involved palliative care team today.   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 Janese Banks, oncology  CKD stage IIIa -Baseline creatinine 1.1-1.4 -Stable  Alcohol abuse, tobacco abuse -Cessation counseling.  Nicotine patch.  CIWA protocol for alcohol dependency  GERD -Continue PPI  DVT prophylaxis:  enoxaparin (LOVENOX) injection 40 mg Start: 11/30/19 1830  Code Status: Full code Family Communication: No family at bedside Disposition Plan:  Status is: Inpatient  Remains inpatient appropriate because:IV treatments appropriate due to intensity of illness or inability to take PO and Inpatient level of care appropriate due to severity of illness   Dispo: The patient is from: Home              Anticipated d/c is to: Home              Anticipated d/c date is: > 3 days              Patient currently is not medically stable to d/c.  Remains on IV antibiotics.  WBC improving.  Speech evaluation revealed high risk of aspiration.  Made n.p.o. today.  Consultants:   Palliative care medicine  Procedures:   None  Antimicrobials:  Anti-infectives (From admission, onward)   Start     Dose/Rate Route Frequency Ordered Stop   12/02/19 1030  ceFEPIme (MAXIPIME) 1 g in sodium chloride 0.9 % 100 mL IVPB  1 g 200 mL/hr over 30 Minutes Intravenous Every 24 hours 12/02/19 0933     12/02/19 0400  meropenem (MERREM) 1 g in sodium chloride 0.9 % 100 mL IVPB  Status:  Discontinued        1 g 200 mL/hr over 30 Minutes Intravenous Every 8 hours 12/02/19 0355 12/02/19 0933   12/02/19 0100  metroNIDAZOLE (FLAGYL) IVPB 500 mg  Status:  Discontinued        500 mg 100 mL/hr over 60 Minutes Intravenous Every 8 hours 12/02/19  0002 12/02/19 0424   12/01/19 0800  vancomycin (VANCOREADY) IVPB 500 mg/100 mL  Status:  Discontinued        500 mg 100 mL/hr over 60 Minutes Intravenous Every 12 hours 11/30/19 1841 12/01/19 0736   11/30/19 1930  ceFEPIme (MAXIPIME) 2 g in sodium chloride 0.9 % 100 mL IVPB  Status:  Discontinued        2 g 200 mL/hr over 30 Minutes Intravenous Every 12 hours 11/30/19 1841 12/02/19 0352   11/30/19 1930  vancomycin (VANCOREADY) IVPB 1500 mg/300 mL        1,500 mg 150 mL/hr over 120 Minutes Intravenous  Once 11/30/19 1841 12/01/19 0128   11/30/19 1145  cefTRIAXone (ROCEPHIN) 2 g in sodium chloride 0.9 % 100 mL IVPB  Status:  Discontinued        2 g 200 mL/hr over 30 Minutes Intravenous Every 24 hours 11/30/19 1144 11/30/19 1830   11/30/19 1145  azithromycin (ZITHROMAX) 500 mg in sodium chloride 0.9 % 250 mL IVPB  Status:  Discontinued        500 mg 250 mL/hr over 60 Minutes Intravenous Every 24 hours 11/30/19 1144 11/30/19 1830       Objective: Vitals:   12/02/19 0501 12/02/19 0625 12/02/19 0640 12/02/19 0825  BP: 135/76  126/79 127/85  Pulse: (!) 115  (!) 109 (!) 110  Resp:   20 17  Temp:   98.3 F (36.8 C) 98.6 F (37 C)  TempSrc:   Oral Oral  SpO2: 90%  98% 97%  Weight:  58 kg    Height:        Intake/Output Summary (Last 24 hours) at 12/02/2019 0941 Last data filed at 12/02/2019 0625 Gross per 24 hour  Intake 414.31 ml  Output 1100 ml  Net -685.69 ml   Filed Weights   11/30/19 1026 12/01/19 0400 12/02/19 0625  Weight: 60.3 kg 57.9 kg 58 kg    Examination: General exam: Appears calm and comfortable, thin and chronically ill-appearing Respiratory system: + Rhonchi.  Without respiratory distress Cardiovascular system: S1 & S2 heard, RRR. No pedal edema. Gastrointestinal system: Abdomen is nondistended, soft and nontender. Normal bowel sounds heard. Central nervous system: Alert and oriented. Non focal exam. Speech clear  Extremities: Symmetric in appearance  bilaterally  Skin: No rashes, lesions or ulcers on exposed skin  Psychiatry: Judgement and insight appear stable. Mood & affect appropriate.   Data Reviewed: I have personally reviewed following labs and imaging studies  CBC: Recent Labs  Lab 11/29/19 1635 11/30/19 1041 12/01/19 0514 12/02/19 0510  WBC 20.4* 20.4* 14.7* 13.3*  NEUTROABS 16.0* 18.1*  --   --   HGB 12.1* 12.3* 11.6* 11.4*  HCT 37.2* 36.8* 36.2* 34.9*  MCV 91.6 90.2 93.5 91.4  PLT 344 329 299 132   Basic Metabolic Panel: Recent Labs  Lab 11/29/19 1635 11/30/19 1041 12/01/19 0514 12/02/19 0510  NA 133* 134* 138 138  K 3.8 3.6 3.9  3.8  CL 93* 94* 102 103  CO2 28 26 24 24   GLUCOSE 125* 134* 100* 103*  BUN 19 22* 22* 23*  CREATININE 1.39* 1.33* 1.23 0.88  CALCIUM 9.5 9.2 8.9 9.3   GFR: Estimated Creatinine Clearance: 73.2 mL/min (by C-G formula based on SCr of 0.88 mg/dL). Liver Function Tests: Recent Labs  Lab 11/29/19 1635 11/30/19 1041  AST 68* 77*  ALT 33 40  ALKPHOS 114 107  BILITOT 2.0* 1.8*  PROT 7.9 7.5  ALBUMIN 3.2* 2.9*   No results for input(s): LIPASE, AMYLASE in the last 168 hours. No results for input(s): AMMONIA in the last 168 hours. Coagulation Profile: No results for input(s): INR, PROTIME in the last 168 hours. Cardiac Enzymes: No results for input(s): CKTOTAL, CKMB, CKMBINDEX, TROPONINI in the last 168 hours. BNP (last 3 results) No results for input(s): PROBNP in the last 8760 hours. HbA1C: No results for input(s): HGBA1C in the last 72 hours. CBG: No results for input(s): GLUCAP in the last 168 hours. Lipid Profile: No results for input(s): CHOL, HDL, LDLCALC, TRIG, CHOLHDL, LDLDIRECT in the last 72 hours. Thyroid Function Tests: No results for input(s): TSH, T4TOTAL, FREET4, T3FREE, THYROIDAB in the last 72 hours. Anemia Panel: No results for input(s): VITAMINB12, FOLATE, FERRITIN, TIBC, IRON, RETICCTPCT in the last 72 hours. Sepsis Labs: Recent Labs  Lab  11/29/19 1635 11/30/19 1041 11/30/19 1229 11/30/19 1433  LATICACIDVEN 1.7 2.1* 2.0* 1.6    Recent Results (from the past 240 hour(s))  Culture, blood (single)     Status: None (Preliminary result)   Collection Time: 11/30/19 12:29 PM   Specimen: BLOOD  Result Value Ref Range Status   Specimen Description BLOOD BLOOD LEFT FOREARM  Final   Special Requests   Final    Blood Culture results may not be optimal due to an excessive volume of blood received in culture bottles   Culture   Final    NO GROWTH 2 DAYS Performed at [facility], [address], [place], [place] [address]    Report Status PENDING  Incomplete  Respiratory Panel by RT PCR (Flu A&B, Covid) - Nasopharyngeal Swab     Status: None   Collection Time: 11/30/19 12:48 PM   Specimen: Nasopharyngeal Swab  Result Value Ref Range Status   SARS Coronavirus 2 by RT PCR NEGATIVE NEGATIVE Final    Comment: (NOTE) SARS-CoV-2 target nucleic acids are NOT DETECTED.  The SARS-CoV-2 RNA is generally detectable in upper respiratoy specimens during the acute phase of infection. The lowest concentration of SARS-CoV-2 viral copies this assay can detect is 131 copies/mL. A negative result does not preclude SARS-Cov-2 infection and should not be used as the sole basis for treatment or other patient management decisions. A negative result may occur with  improper specimen collection/handling, submission of specimen other than nasopharyngeal swab, presence of viral mutation(s) within the areas targeted by this assay, and inadequate number of viral copies (<131 copies/mL). A negative result must be combined with clinical observations, patient history, and epidemiological information. The expected result is Negative.  Fact Sheet for Patients:  PinkCheek.be  Fact Sheet for Healthcare Providers:  GravelBags.it  This test is no t yet approved or cleared by the [place] FDA and  has been authorized for detection and/or diagnosis of SARS-CoV-2 by FDA under an Emergency Use Authorization (EUA). This EUA will remain  in effect (meaning this test can be used) for the duration of the COVID-19 declaration under Section 564(b)(1)  of the Act, 21 U.S.C. section 360bbb-3(b)(1), unless the authorization is terminated or revoked sooner.     Influenza A by PCR NEGATIVE NEGATIVE Final   Influenza B by PCR NEGATIVE NEGATIVE Final    Comment: (NOTE) The Xpert Xpress SARS-CoV-2/FLU/RSV assay is intended as an aid in  the diagnosis of influenza from Nasopharyngeal swab specimens and  should not be used as a sole basis for treatment. Nasal washings and  aspirates are unacceptable for Xpert Xpress SARS-CoV-2/FLU/RSV  testing.  Fact Sheet for Patients: PinkCheek.be  Fact Sheet for Healthcare Providers: GravelBags.it  This test is not yet approved or cleared by the [place] FDA and  has been authorized for detection and/or diagnosis of SARS-CoV-2 by  FDA under an Emergency Use Authorization (EUA). This EUA will remain  in effect (meaning this test can be used) for the duration of the  Covid-19 declaration under Section 564(b)(1) of the Act, 21  U.S.C. section 360bbb-3(b)(1), unless the authorization is  terminated or revoked. Performed at [facility], [address] [place], [place] [address]   Expectorated sputum assessment w rflx to resp cult     Status: None   Collection Time: 11/30/19 12:48 PM   Specimen: Sputum  Result Value Ref Range Status   Specimen Description SPU EXPECTORATED SPUTUM  Final   Special Requests NONE  Final   Sputum evaluation   Final    THIS SPECIMEN IS ACCEPTABLE FOR SPUTUM CULTURE Performed at [facility], [address] [place], [place] [address]    Report Status 11/30/2019 FINAL  Final  Culture, respiratory     Status: None   Collection  Time: 11/30/19 12:48 PM   Specimen: Sputum  Result Value Ref Range Status   Specimen Description   Final    SPU EXPECTORATED SPUTUM Performed at [facility], [address] [place], [place] [address]    Special Requests   Final    NONE Reflexed from [id number] Performed at [facility], [address] [place], [place] [address]    Gram Stain   Final    ABUNDANT WBC PRESENT,BOTH PMN AND MONONUCLEAR ABUNDANT GRAM POSITIVE COCCI ABUNDANT GRAM NEGATIVE RODS MODERATE GRAM POSITIVE RODS RARE YEAST    Culture   Final    Normal respiratory flora-no Staph aureus or Pseudomonas seen Performed at [facility], [place] [address] [place], [place] [address]    Report Status 12/02/2019 FINAL  Final  MRSA PCR Screening     Status: None   Collection Time: 11/30/19 10:15 PM   Specimen: Nasopharyngeal  Result Value Ref Range Status   MRSA by PCR NEGATIVE NEGATIVE Final    Comment:        The GeneXpert MRSA Assay (FDA approved for NASAL specimens only), is one component of a comprehensive MRSA colonization surveillance program. It is not intended to diagnose MRSA infection nor to guide or monitor treatment for MRSA infections. Performed at [facility], [address], [place],  [address]       Radiology Studies: [facility] Chest [place] 1 View  Result Date: 12/01/2019 CLINICAL DATA:  Congestion. Pneumonia. Cough, fatigue and weakness. EXAM: PORTABLE CHEST 1 VIEW COMPARISON:  Chest radiograph 11/29/2019.  CT 08/13/2019 FINDINGS: Progression of patchy bilateral basilar lung opacities over the past 2 days. This is significantly increased on the right. Normal heart size and mediastinal contours. No significant pleural effusion. No pneumothorax. No pulmonary edema. No acute osseous abnormalities are seen. Patient's chin obscures the  lung apices. IMPRESSION: Progression of patchy bilateral basilar lung opacities over the past 2 days. Findings suspicious for  pneumonia, including aspiration. Electronically Signed   By: Keith Rake M.D.   On: 12/01/2019 23:32      Scheduled Meds: . enoxaparin (LOVENOX) injection  40 mg Subcutaneous Q24H  . nicotine  21 mg Transdermal Daily  . thiamine  100 mg Intravenous Daily   Continuous Infusions: . sodium chloride 10 mL/hr at 12/02/19 0430  . ceFEPime (MAXIPIME) IV       LOS: 2 days      Time spent: 35 minutes   Dessa Phi, DO [facility] 12/02/2019, 9:41 AM   Available via Epic secure chat 7am-7pm After these hours, please refer to coverage provider listed on [email or web address]

## 2019-12-02 NOTE — Progress Notes (Signed)
Patients heart rate maintaining 124-128. Iv labetalol 5mg  IV slowly pushed. Current heart 110, sat 94% on 3L, resp. 24. Will continue to monitor closely

## 2019-12-02 NOTE — Progress Notes (Signed)
Spoke with respiratory therapist Joanne Chars to make aware patient has large amounts of thick secretion. Patient has been suctioned with Yankauer twice this am. Asked RT to look at patient and suggestions for further treatments. Also spoke with jennifer speech therapist to make aware patient has a consult and to verify if patient needs to be NPO. Per speech patient does not need to be NPO, she will come to bedside to evaluate patient. Made speech aware patient has difficulties with water and large amount of thick secretions.

## 2019-12-02 NOTE — Progress Notes (Signed)
   12/01/19 2153  Assess: MEWS Score  Temp 98.5 F (36.9 C)  BP 138/88  Pulse Rate (!) 112  Resp 20  SpO2 100 %  O2 Device Nasal Cannula  O2 Flow Rate (L/min) 3 L/min  Assess: MEWS Score  MEWS Temp 0  MEWS Systolic 0  MEWS Pulse 2  MEWS RR 0  MEWS LOC 0  MEWS Score 2  MEWS Score Color Yellow  Assess: if the MEWS score is Yellow or Red  Were vital signs taken at a resting state? Yes  Focused Assessment No change from prior assessment  Early Detection of Sepsis Score *See Row Information* High  MEWS guidelines implemented *See Row Information* Yes  Treat  MEWS Interventions Escalated (See documentation below);Consulted Respiratory Therapy  Pain Score 0  Take Vital Signs  Increase Vital Sign Frequency  Yellow: Q 2hr X 2 then Q 4hr X 2, if remains yellow, continue Q 4hrs  Escalate  MEWS: Escalate Yellow: discuss with charge nurse/RN and consider discussing with provider and RRT  Notify: Charge Nurse/RN  Name of Charge Nurse/RN Notified Britt Bolognese  Date Charge Nurse/RN Notified 12/01/19  Time Charge Nurse/RN Notified 2200  Notify: Provider  Provider Name/Title Sharion Settler NP  Date Provider Notified 12/01/19  Time Provider Notified 2250  Notification Type Page  Notification Reason Change in status  Response See new orders  Date of Provider Response 12/01/19  Time of Provider Response 2256  Document  Patient Outcome Stabilized after interventions  copy and pasted on behalf of Ernestina Penna

## 2019-12-02 NOTE — Evaluation (Signed)
Clinical/Bedside Swallow Evaluation Patient Details  Name: Tyler Holloway MRN: [id number] Date of Birth: Apr 08, 1960  Today's Date: 12/02/2019 Time: SLP Start Time (ACUTE ONLY): 0900 SLP Stop Time (ACUTE ONLY): 0957 SLP Time Calculation (min) (ACUTE ONLY): 57 min  Past Medical History:  Past Medical History:  Diagnosis Date   Cancer ([place])    Hypertension    Past Surgical History:  Past Surgical History:  Procedure Laterality Date   LARYNGOSCOPY Bilateral 03/02/2019   Procedure: DIRECT LARYNGOSCOPY W/ BX.;  Surgeon: Margaretha Sheffield, MD;  Location: [facility];  Service: ENT;  Laterality: Bilateral;   NO PAST SURGERIES     RIGID ESOPHAGOSCOPY Bilateral 03/02/2019   Procedure: RIGID ESOPHAGOSCOPY RIGID BRONCOSCOPY;  Surgeon: Margaretha Sheffield, MD;  Location: [facility];  Service: ENT;  Laterality: Bilateral;   HPI:   Per admitting H and P "Tyler Holloway is a 59 y.o. 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   Assessment / Plan / Recommendation Clinical Impression   [name] is a 59 y.o. male with medical history significant of squamous cell carcinoma of supraglottic larynx stage II T2 N0 M0 (s/p radiation), HTN, GERD, HBV, HCV, tobacco abuse, alcohol use. He was admitted for increased respiratory difficulties and pneumonia. Bedside swallow eval today revealed high risk of aspiration with any PO's. Upon entering, Pt presented with audibly congested breathing, aphonia, and inability to produce a good cough. Pt has been using yankar suctioning with Nsg assistance. Oral mech exam revealed structures to be functioning adequately with no apparent weakness. Oral care provided. Given I piece of ice, Pt was able to manipulate the bolus adequately but presented with delayed coughing after words.  Coughing was productive. After a period of rest, Pt also took one sip of water which also resulted in significant coughing. Pt was eventually able to produce a successful productive cough expectorating thick phlegm. Rec NPO at this time. Pt complains of thirst therefore, ice chips in moderation is a reasonable option. Would hold off on water or other Po's at this time for fear of respiratory arrest with significant coughing. Secure chat sent to MD for NPO, consider alternative method of nutrition. ST to f/u Monday in hopes of overall improvement. Will need instrumental assessment prior to starting any PO diet. Pt also likely with VF dysfunction, may need ENT consult at some point as well. Prognosis guarded. SLP Visit Diagnosis: Dysphagia, oropharyngeal phase (R13.12)    Aspiration Risk  Severe aspiration risk;Risk for inadequate nutrition/hydration    Diet Recommendation NPO;Ice chips PRN after oral care   Medication Administration: Via alternative means    Other  Recommendations Recommended Consults: Consider ENT evaluation;Consider GI evaluation;Other (Comment) (GI for possible feeding tube) Oral Care Recommendations: Oral care prior to ice chip/H20;Oral care BID Other Recommendations: Have oral suction available   Follow up Recommendations   May need ST at dischargeF     Frequency and Duration     F/U with overall status Monday       Prognosis Prognosis for Safe Diet Advancement: Guarded Barriers to Reach Goals: Severity of deficits      Swallow Study   General Date of Onset: 11/30/19 HPI:  Per admitting H and P "Tyler Holloway is a 59 y.o. male with medical history significant of squamous cell carcinoma of supraglottic larynxstage II T2 N0  M0(s/p radiation), HTN, GERD, HBV, HCV, tobacco abuse, alcohol use, CKD-3, who presents with cough and shortness of breath. Type of Study: Bedside Swallow Evaluation Diet Prior to this Study: Regular Respiratory Status: Nasal cannula History  of Recent Intubation: No Behavior/Cognition: Alert;Cooperative;Confused Oral Cavity Assessment: Excessive secretions Oral Care Completed by SLP: Yes Oral Cavity - Dentition: Poor condition;Missing dentition Self-Feeding Abilities: Able to feed self Patient Positioning: Upright in bed Baseline Vocal Quality: Breathy;Hoarse;Wet;Low vocal intensity Volitional Cough: Weak;Wet;Congested    Oral/Motor/Sensory Function Overall Oral Motor/Sensory Function: Within functional limits   Ice Chips Ice chips: Impaired Presentation: Spoon Pharyngeal Phase Impairments: Cough - Delayed   Thin Liquid Thin Liquid: Impaired Presentation: Straw Pharyngeal  Phase Impairments: Cough - Immediate;Cough - Delayed    Nectar Thick Nectar Thick Liquid: Not tested   Honey Thick Honey Thick Liquid: Not tested   Puree Puree: Not tested   Solid     Solid: Not tested      [name] 12/02/2019,9:57 AM

## 2019-12-02 NOTE — Progress Notes (Signed)
PHARMACY NOTE:  ANTIMICROBIAL RENAL DOSAGE ADJUSTMENT  Current antimicrobial regimen includes a mismatch between antimicrobial dosage and estimated renal function.  As per policy approved by the Pharmacy & Therapeutics and Medical Executive Committees, the antimicrobial dosage will be adjusted accordingly.  Current antimicrobial dosage:  Cefepime 1 g IV q24h  Indication: HCAP  Renal Function:  Estimated Creatinine Clearance: 73.2 mL/min (by C-G formula based on SCr of 0.88 mg/dL).     Antimicrobial dosage has been changed to:  Cefepime 2 g IV q8h    Thank you for allowing pharmacy to be a part of this patient's care.  Dorena Bodo, PharmD 12/02/2019 9:50 AM

## 2019-12-03 DIAGNOSIS — F1010 Alcohol abuse, uncomplicated: Secondary | ICD-10-CM

## 2019-12-03 DIAGNOSIS — J9601 Acute respiratory failure with hypoxia: Secondary | ICD-10-CM

## 2019-12-03 DIAGNOSIS — Z66 Do not resuscitate: Secondary | ICD-10-CM

## 2019-12-03 DIAGNOSIS — F10939 Alcohol use, unspecified with withdrawal, unspecified: Secondary | ICD-10-CM

## 2019-12-03 DIAGNOSIS — R1310 Dysphagia, unspecified: Secondary | ICD-10-CM

## 2019-12-03 DIAGNOSIS — Z515 Encounter for palliative care: Secondary | ICD-10-CM

## 2019-12-03 DIAGNOSIS — F10239 Alcohol dependence with withdrawal, unspecified: Secondary | ICD-10-CM

## 2019-12-03 LAB — BASIC METABOLIC PANEL
Anion gap: 9 (ref 5–15)
BUN: 23 mg/dL — ABNORMAL HIGH (ref 6–20)
CO2: 26 mmol/L (ref 22–32)
Calcium: 9.5 mg/dL (ref 8.9–10.3)
Chloride: 108 mmol/L (ref 98–111)
Creatinine, Ser: 0.93 mg/dL (ref 0.61–1.24)
GFR, Estimated: >60 mL/min (ref >60)
Glucose, Bld: 103 mg/dL — ABNORMAL HIGH (ref 70–99)
Potassium: 3.6 mmol/L (ref 3.5–5.1)
Sodium: 143 mmol/L (ref 135–145)

## 2019-12-03 LAB — CBC
HCT: 34.3 % — ABNORMAL LOW (ref 39.0–52.0)
Hemoglobin: 11.2 g/dL — ABNORMAL LOW (ref 13.0–17.0)
MCH: 30 pg (ref 26.0–34.0)
MCHC: 32.7 g/dL (ref 30.0–36.0)
MCV: 92 fL (ref 80.0–100.0)
Platelets: 355 10*3/uL (ref 150–400)
RBC: 3.73 MIL/uL — ABNORMAL LOW (ref 4.22–5.81)
RDW: 13.3 % (ref 11.5–15.5)
WBC: 14.6 10*3/uL — ABNORMAL HIGH (ref 4.0–10.5)
nRBC: 0 % (ref 0.0–0.2)

## 2019-12-03 LAB — LEGIONELLA PNEUMOPHILA SEROGP 1 UR AG: L. pneumophila Serogp 1 Ur Ag: NEGATIVE

## 2019-12-03 MED ORDER — LORAZEPAM 2 MG/ML IJ SOLN
1.0000 mg | INTRAMUSCULAR | Status: DC | PRN
  Administered 2019-12-03 – 2019-12-04 (×5): 1 mg via INTRAVENOUS
  Filled 2019-12-03 (×5): qty 1

## 2019-12-03 MED ORDER — LORAZEPAM 1 MG PO TABS: 1.0000 mg | ORAL_TABLET | ORAL | Status: DC | PRN

## 2019-12-03 MED ORDER — MORPHINE SULFATE (PF) 2 MG/ML IV SOLN
1.0000 mg | INTRAVENOUS | Status: DC | PRN
  Administered 2019-12-03 – 2019-12-04 (×8): 1 mg via INTRAVENOUS
  Filled 2019-12-03 (×8): qty 1

## 2019-12-03 MED ORDER — SCOPOLAMINE 1 MG/3DAYS TD PT72
1.0000 | MEDICATED_PATCH | TRANSDERMAL | Status: DC
  Administered 2019-12-03: 1.5 mg via TRANSDERMAL
  Filled 2019-12-03: qty 1

## 2019-12-03 MED ORDER — LORAZEPAM 2 MG/ML IJ SOLN
1.0000 mg | INTRAMUSCULAR | Status: DC | PRN
  Administered 2019-12-03 – 2019-12-04 (×6): 2 mg via INTRAVENOUS
  Filled 2019-12-03 (×6): qty 1

## 2019-12-03 NOTE — TOC Progression Note (Signed)
Transition of Care [facility]) - Progression Note    Patient Details  Name: Tyler Holloway MRN: [id number] Date of Birth: 1960-04-07  Transition of Care [facility]) CM/SW Contact  [name], LCSW Phone Number: 12/03/2019, 5:07 PM  Clinical Narrative:            Expected Discharge Plan and Services                                                 Social Determinants of Health (SDOH) Interventions    Readmission Risk Interventions No flowsheet data found.

## 2019-12-03 NOTE — Progress Notes (Signed)
[facility]) Hospital Liaison: RN note    Notified by Transition of Care Manger of patient/family request for [facility] services at home after discharge. Chart and patient information under review by [facility] physician. Hospice eligibility pending currently.    Writer spoke with sister Malachy Mood to initiate education related to hospice philosophy, services and team approach to care. Malachy Mood verbalized understanding of information given. Per discussion, plan is for discharge to home by PTAR depending on patients current status.    Please send signed and completed DNR form home with patient/family. Patient will need prescriptions for discharge comfort medications.     DME needs have been discussed, patient currently has the following equipment in the home: none.  Patient/family requests the following DME for delivery to the home: Oxygen, Hospital bed, and suction set up . [employer] equipment manager has been notified and will contact DME provider to arrange delivery to the home. Home address has been verified and is correct in the chart.       Malachy Mood is the family member to contact to arrange time of delivery.     [facility] aware of the above. Please notify [facility] when patient is ready to leave the unit at discharge. (Call [phone] or [phone] after 5pm.) [facility] information and contact numbers given to [facility].      Please call with any hospice related questions.     Thank you for this referral.     Clementeen Hoof, BSN, [facility] (listed on AMION under [facility] [place])  [phone]

## 2019-12-03 NOTE — Progress Notes (Signed)
   12/02/19 2015  Assess: MEWS Score  Temp 98.7 F (37.1 C)  BP (!) 142/77  Pulse Rate (!) 117  Resp (!) 28  SpO2 99 %  O2 Device Nasal Cannula  O2 Flow Rate (L/min) 5 L/min  Assess: MEWS Score  MEWS Temp 0  MEWS Systolic 0  MEWS Pulse 2  MEWS RR 2  MEWS LOC 0  MEWS Score 4  MEWS Score Color Red  Assess: if the MEWS score is Yellow or Red  Were vital signs taken at a resting state? Yes  Focused Assessment No change from prior assessment  Early Detection of Sepsis Score *See Row Information* High  MEWS guidelines implemented *See Row Information* Yes  Treat  MEWS Interventions Administered prn meds/treatments (suction; prn meds)  Take Vital Signs  Increase Vital Sign Frequency  Red: Q 1hr X 4 then Q 4hr X 4, if remains red, continue Q 4hrs  Escalate  MEWS: Escalate Red: discuss with charge nurse/RN and provider, consider discussing with RRT  Notify: Charge Nurse/RN  Name of Charge Nurse/RN Notified Jake Samples  Date Charge Nurse/RN Notified 12/02/19  Time Charge Nurse/RN Notified 2053  Notify: Provider  Provider Name/Title Sharion Settler NP  Date Provider Notified 12/02/19  Time Provider Notified 2045  Notification Type Page  Notification Reason Change in status  Response Other (Comment)  Date of Provider Response 12/02/19  Time of Provider Response 2047  Document  Patient Outcome Stabilized after interventions  Progress note created (see row info) Yes

## 2019-12-03 NOTE — Progress Notes (Signed)
Staff went into the pt room immediately the alarm went off to find pt on the floor mat. Pt confused, trying to get up. Staff assist pt back in the bed, he kept voicing out he want to go home. Family(pt's sister notified Tyler Holloway), staff ask the sister if a family can come stay with the pt, she said she will see what she can work out..  MD notified as well, RN requested for the doctor to reorder the CIWA artivan. No injury noted, staff will continue to monitor pt.

## 2019-12-03 NOTE — Progress Notes (Addendum)
PROGRESS NOTE    Tyler Holloway  WLN:[id number] DOB: May 19, 1960 DOA: 11/30/2019 PCP: Patient, No Pcp Per     Brief Narrative:  Tyler Holloway is a 59 y.o. 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 Pt states that he has been having cough and congestion for several days, which has been progressively worsening. He has mild shortness of breath and mild pleuritic chest pain. Patient has chills and fever with temperature 101 in ED. Chest x-ray showed left lower lobe infiltration.  Patient underwent treatment for pneumonia.  He was evaluated by speech-language pathology, determined to be high risk aspiration.  He was made n.p.o.  On 11/6, he started to go into alcohol withdrawal.  He was treated with CIWA protocol and IV Ativan.  New events last 24 hours / Subjective: Continues to be critically ill.  Respiratory status has worsened, now requiring 5 L of oxygen and breathing remains tenuous.  He remains confused in bilateral hand mittens, agitated.  I had a long discussion with family at bedside including his only daughter, sister, 2 nieces.  I discussed my concern for his respiratory status, alcohol withdrawal, dysphagia in need of alternate means of nutrition.  Sister noted that patient would never have wanted a feeding tube, even if it meant death.  After discussion, family elected to transition patient to comfort care and have patient pass at home with home hospice.  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  He was recommended for n.p.o. status, alternate means of nutrition.  He is also going through alcohol withdrawal, remains encephalopathic.  Discussed with multiple family members, they note that patient would never have wanted a feeding tube.  Even if he did wish for feeding tube, he remains a very poor candidate due to encephalopathy and respiratory failure.  After discussion with family members, they decided to transition to comfort care and home hospice.  TOC notified.   DVT prophylaxis:   Code Status: DNR Family Communication: Family at bedside Disposition Plan:  Status is: Inpatient  Remains inpatient appropriate because:Unsafe d/c plan   Dispo: The patient is from: Home              Anticipated d/c is to: Home hospice              Anticipated d/c date is: 1 day              Patient currently waiting for home hospice. He may discharge to family care once home hospice is set up in next 24 hours.    Consultants:   None   Procedures:   None  Antimicrobials:  Anti-infectives (From admission, onward)   Start     Dose/Rate Route Frequency Ordered Stop  12/02/19 1000  ceFEPIme (MAXIPIME) 2 g in sodium chloride 0.9 % 100 mL IVPB  Status:  Discontinued        2 g 200 mL/hr over 30 Minutes Intravenous Every 8 hours 12/02/19 0933 12/03/19 1203   12/02/19 0400  meropenem (MERREM) 1 g in sodium chloride 0.9 % 100 mL IVPB  Status:  Discontinued        1 g 200 mL/hr over 30 Minutes Intravenous Every 8 hours 12/02/19 0355 12/02/19 0933   12/02/19 0100  metroNIDAZOLE (FLAGYL) IVPB 500 mg  Status:  Discontinued        500 mg 100 mL/hr over 60 Minutes Intravenous Every 8 hours 12/02/19 0002  12/02/19 0424   12/01/19 0800  vancomycin (VANCOREADY) IVPB 500 mg/100 mL  Status:  Discontinued        500 mg 100 mL/hr over 60 Minutes Intravenous Every 12 hours 11/30/19 1841 12/01/19 0736   11/30/19 1930  ceFEPIme (MAXIPIME) 2 g in sodium chloride 0.9 % 100 mL IVPB  Status:  Discontinued        2 g 200 mL/hr over 30 Minutes Intravenous Every 12 hours 11/30/19 1841 12/02/19 0352   11/30/19 1930  vancomycin (VANCOREADY) IVPB 1500 mg/300 mL        1,500 mg 150 mL/hr over 120 Minutes Intravenous  Once 11/30/19 1841 12/01/19 0128   11/30/19 1145  cefTRIAXone (ROCEPHIN) 2 g in sodium chloride 0.9 % 100 mL IVPB  Status:  Discontinued        2 g 200 mL/hr over 30 Minutes Intravenous Every 24 hours 11/30/19 1144 11/30/19 1830   11/30/19 1145  azithromycin (ZITHROMAX) 500 mg in sodium chloride 0.9 % 250 mL IVPB  Status:  Discontinued        500 mg 250 mL/hr over 60 Minutes Intravenous Every 24 hours 11/30/19 1144 11/30/19 1830       Objective: Vitals:   12/03/19 0500 12/03/19 0622 12/03/19 0700 12/03/19 0800  BP: (!) 147/86 (!) 151/81 (!) 147/91 (!) 147/91  Pulse: (!) 120 (!) 118 (!) 115 (!) 115  Resp: (!) 31   (!) 29  Temp: 98.3 F (36.8 C)  98.5 F (36.9 C) 98.6 F (37 C)  TempSrc: Oral  Axillary   SpO2:    92%  Weight:      Height:        Intake/Output Summary (Last 24 hours) at 12/03/2019 1234 Last data filed at 12/02/2019 1400 Gross per 24 hour  Intake --  Output 300 ml  Net -300 ml   Filed Weights   11/30/19 1026 12/01/19 0400 12/02/19 0625  Weight: 60.3 kg 57.9 kg 58 kg    Examination: General exam: Appears confused, ill-appearing, cachectic Respiratory system: Rhonchi diffusely, with increase in respiratory effort, tachypneic Cardiovascular system: S1 & S2 heard, tachycardic, regular rhythm. No pedal edema. Gastrointestinal system: Abdomen is nondistended, soft and nontender. Central nervous system: Alert but delirious and confused Extremities: Symmetric in  appearance bilaterally  Skin: No rashes, lesions or ulcers on exposed skin     Data Reviewed: I have personally reviewed following labs and imaging studies  CBC: Recent Labs  Lab 11/29/19 1635 11/30/19 1041 12/01/19 0514 12/02/19 0510 12/03/19 0627  WBC 20.4* 20.4* 14.7* 13.3* 14.6*  NEUTROABS 16.0* 18.1*  --   --   --   HGB 12.1* 12.3* 11.6* 11.4* 11.2*  HCT 37.2* 36.8* 36.2* 34.9* 34.3*  MCV 91.6 90.2 93.5 91.4 92.0  PLT 344 329 299 326 355  Basic Metabolic Panel: Recent Labs  Lab 11/29/19 1635 11/30/19 1041 12/01/19 0514 12/02/19 0510 12/03/19 0627  NA 133* 134* 138 138 143  K 3.8 3.6 3.9 3.8 3.6  CL 93* 94* 102 103 108  CO2 28 26 24 24 26   GLUCOSE 125* 134* 100* 103* 103*  BUN 19 22* 22* 23* 23*  CREATININE 1.39* 1.33* 1.23 0.88 0.93  CALCIUM 9.5 9.2 8.9 9.3 9.5   GFR: Estimated Creatinine Clearance: 69.3 mL/min (by C-G formula based on SCr of 0.93 mg/dL). Liver Function Tests: Recent Labs  Lab 11/29/19 1635 11/30/19 1041  AST 68* 77*  ALT 33 40  ALKPHOS 114 107  BILITOT 2.0* 1.8*  PROT 7.9 7.5  ALBUMIN 3.2* 2.9*   No results for input(s): LIPASE, AMYLASE in the last 168 hours. No results for input(s): AMMONIA in the last 168 hours. Coagulation Profile: No results for input(s): INR, PROTIME in the last 168 hours. Cardiac Enzymes: No results for input(s): CKTOTAL, CKMB, CKMBINDEX, TROPONINI in the last 168 hours. BNP (last 3 results) No results for input(s): PROBNP in the last 8760 hours. HbA1C: No results for input(s): HGBA1C in the last 72 hours. CBG: No results for input(s): GLUCAP in the last 168 hours. Lipid Profile: No results for input(s): CHOL, HDL, LDLCALC, TRIG, CHOLHDL, LDLDIRECT in the last 72 hours. Thyroid Function Tests: No results for input(s): TSH, T4TOTAL, FREET4, T3FREE, THYROIDAB in the last 72 hours. Anemia Panel: No results for input(s): VITAMINB12, FOLATE, FERRITIN, TIBC, IRON, RETICCTPCT in the last 72 hours. Sepsis  Labs: Recent Labs  Lab 11/29/19 1635 11/30/19 1041 11/30/19 1229 11/30/19 1433  LATICACIDVEN 1.7 2.1* 2.0* 1.6    Recent Results (from the past 240 hour(s))  Culture, blood (single)     Status: None (Preliminary result)   Collection Time: 11/30/19 12:29 PM   Specimen: BLOOD  Result Value Ref Range Status   Specimen Description BLOOD BLOOD LEFT FOREARM  Final   Special Requests   Final    Blood Culture results may not be optimal due to an excessive volume of blood received in culture bottles   Culture   Final    NO GROWTH 2 DAYS Performed at [facility], [address], [place], [place] [address]    Report Status PENDING  Incomplete  Respiratory Panel by RT PCR (Flu A&B, Covid) - Nasopharyngeal Swab     Status: None   Collection Time: 11/30/19 12:48 PM   Specimen: Nasopharyngeal Swab  Result Value Ref Range Status   SARS Coronavirus 2 by RT PCR NEGATIVE NEGATIVE Final    Comment: (NOTE) SARS-CoV-2 target nucleic acids are NOT DETECTED.  The SARS-CoV-2 RNA is generally detectable in upper respiratoy specimens during the acute phase of infection. The lowest concentration of SARS-CoV-2 viral copies this assay can detect is 131 copies/mL. A negative result does not preclude SARS-Cov-2 infection and should not be used as the sole basis for treatment or other patient management decisions. A negative result may occur with  improper specimen collection/handling, submission of specimen other than nasopharyngeal swab, presence of viral mutation(s) within the areas targeted by this assay, and inadequate number of viral copies (<131 copies/mL). A negative result must be combined with clinical observations, patient history, and epidemiological information. The expected result is Negative.  Fact Sheet for Patients:  PinkCheek.be  Fact Sheet for Healthcare Providers:  GravelBags.it  This test is no t yet approved or  cleared by the [place] and  has been authorized for  detection and/or diagnosis of SARS-CoV-2 by FDA under an Emergency Use Authorization (EUA). This EUA will remain  in effect (meaning this test can be used) for the duration of the COVID-19 declaration under Section 564(b)(1) of the Act, 21 U.S.C. section 360bbb-3(b)(1), unless the authorization is terminated or revoked sooner.     Influenza A by PCR NEGATIVE NEGATIVE Final   Influenza B by PCR NEGATIVE NEGATIVE Final    Comment: (NOTE) The Xpert Xpress SARS-CoV-2/FLU/RSV assay is intended as an aid in  the diagnosis of influenza from Nasopharyngeal swab specimens and  should not be used as a sole basis for treatment. Nasal washings and  aspirates are unacceptable for Xpert Xpress SARS-CoV-2/FLU/RSV  testing.  Fact Sheet for Patients: PinkCheek.be  Fact Sheet for Healthcare Providers: GravelBags.it  This test is not yet approved or cleared by the [place] FDA and  has been authorized for detection and/or diagnosis of SARS-CoV-2 by  FDA under an Emergency Use Authorization (EUA). This EUA will remain  in effect (meaning this test can be used) for the duration of the  Covid-19 declaration under Section 564(b)(1) of the Act, 21  U.S.C. section 360bbb-3(b)(1), unless the authorization is  terminated or revoked. Performed at [facility], [address] [place], [place] [address]   Expectorated sputum assessment w rflx to resp cult     Status: None   Collection Time: 11/30/19 12:48 PM   Specimen: Sputum  Result Value Ref Range Status   Specimen Description SPU EXPECTORATED SPUTUM  Final   Special Requests NONE  Final   Sputum evaluation   Final    THIS SPECIMEN IS ACCEPTABLE FOR SPUTUM CULTURE Performed at [facility], [address] [place], [place] [address]    Report Status 11/30/2019 FINAL  Final  Culture, respiratory     Status:  None   Collection Time: 11/30/19 12:48 PM   Specimen: Sputum  Result Value Ref Range Status   Specimen Description   Final    SPU EXPECTORATED SPUTUM Performed at [facility], [address] [place], [place] [address]    Special Requests   Final    NONE Reflexed from [id number] Performed at [facility], [address] [place], [place] [address]    Gram Stain   Final    ABUNDANT WBC PRESENT,BOTH PMN AND MONONUCLEAR ABUNDANT GRAM POSITIVE COCCI ABUNDANT GRAM NEGATIVE RODS MODERATE GRAM POSITIVE RODS RARE YEAST    Culture   Final    Normal respiratory flora-no Staph aureus or Pseudomonas seen Performed at [facility], [place] [address], [place], [place] [address]    Report Status 12/02/2019 FINAL  Final  MRSA PCR Screening     Status: None   Collection Time: 11/30/19 10:15 PM   Specimen: Nasopharyngeal  Result Value Ref Range Status   MRSA by PCR NEGATIVE NEGATIVE Final    Comment:        The GeneXpert MRSA Assay (FDA approved for NASAL specimens only), is one component of a comprehensive MRSA colonization surveillance program. It is not intended to diagnose MRSA infection nor to guide or monitor treatment for MRSA infections. Performed at [facility], [address], [place],  [address]       Radiology Studies: [facility] Chest [place] 1 View  Result Date: 12/01/2019 CLINICAL DATA:  Congestion. Pneumonia. Cough, fatigue and weakness. EXAM: PORTABLE CHEST 1 VIEW COMPARISON:  Chest radiograph 11/29/2019.  CT 08/13/2019 FINDINGS: Progression of patchy bilateral basilar lung opacities over  the past 2 days. This is significantly increased on the right. Normal heart size and mediastinal contours. No significant pleural effusion. No pneumothorax. No pulmonary edema. No acute osseous abnormalities are seen. Patient's chin obscures the lung apices. IMPRESSION: Progression of patchy bilateral basilar lung opacities over the past 2 days. Findings  suspicious for pneumonia, including aspiration. Electronically Signed   By: Keith Rake M.D.   On: 12/01/2019 23:32      Scheduled Meds: . scopolamine  1 patch Transdermal Q72H   Continuous Infusions: . sodium chloride 250 mL (12/03/19 0117)     LOS: 3 days      Time spent: 50 minutes   Dessa Phi, DO [facility] 12/03/2019, 12:34 PM   Available via Epic secure chat 7am-7pm After these hours, please refer to coverage provider listed on [email or web address]

## 2019-12-03 NOTE — TOC Transition Note (Signed)
Transition of Care [facility]) - CM/SW Discharge Note   Patient Details  Name: Tyler Holloway MRN: [id number] Date of Birth: [birth year]  Transition of Care [facility]) CM/SW Contact:  [name], LCSW Phone Number: 12/03/2019, 5:07 PM   Clinical Narrative:    CSW completed referral to hospice for possible hospice home placement if appropriate, or home with hospice. Clementeen Hoof is active with this case and will assess patient. Patient will need DME also if he goes home. Melissa is aware         Patient Goals and CMS Choice        Discharge Placement                       Discharge Plan and Services                                     Social Determinants of Health (SDOH) Interventions     Readmission Risk Interventions No flowsheet data found.

## 2019-12-04 ENCOUNTER — Telehealth: Payer: Self-pay | Admitting: *Deleted

## 2019-12-04 DIAGNOSIS — Z515 Encounter for palliative care: Secondary | ICD-10-CM

## 2019-12-04 DIAGNOSIS — Z7189 Other specified counseling: Secondary | ICD-10-CM

## 2019-12-04 MED ORDER — GLYCOPYRROLATE 0.2 MG/ML IJ SOLN
0.2000 mg | INTRAMUSCULAR | Status: DC | PRN
  Filled 2019-12-04: qty 1

## 2019-12-04 MED ORDER — MORPHINE SULFATE (CONCENTRATE) 10 MG/0.5ML PO SOLN
5.0000 mg | ORAL | Status: DC | PRN
  Administered 2019-12-05: 5 mg via ORAL
  Filled 2019-12-04: qty 0.5

## 2019-12-04 MED ORDER — LORAZEPAM 2 MG/ML PO CONC
2.0000 mg | ORAL | Status: DC | PRN
  Administered 2019-12-04 – 2019-12-05 (×2): 2 mg via ORAL
  Filled 2019-12-04 (×2): qty 1

## 2019-12-04 NOTE — Telephone Encounter (Signed)
ok 

## 2019-12-04 NOTE — Consult Note (Addendum)
Consultation Note Date: 12/04/2019   Patient Name: Tyler Holloway  DOB: 1960-03-27  MRN: [id number]  Age / Sex: 59 y.o., male  PCP: Patient, No Pcp Per Referring Physician: Dessa Phi, DO  Reason for Consultation: Terminal Care  HPI/Patient Profile: Tyler Holloway is a 59 y.o. 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  Clinical Assessment and Goals of Care: Patient is resting in bed on his side with mittens in place. He does not awaken to voice, and moves his arms to rub his face when his feet are touched. Plans for D/C home with hospice already in place. Per hospice liason, plan for D/C home with hospice tomorrow.    Spoke with patient's sister Malachy Mood, and after speaking with her, she advised patient has a daughter and conference called patient's daughter who is his next of kin.   We discussed his diagnosis, prognosis, GOC, EOL wishes disposition and options.  Created space and opportunity for patient  to explore thoughts and feelings regarding current medical information.   A detailed discussion was had today regarding advanced directives.  Concepts specific to code status, artifical feeding and hydration, IV antibiotics and rehospitalization were discussed.  The difference between an aggressive medical intervention path and a comfort care path was discussed.  Values and goals of care important to patient and family were attempted to be elicited.  Discussed limitations of medical interventions to prolong quality of life in some situations and discussed the concept of human mortality.  They discuss that he has been very clear he would not want to stop smoking or drinking though they have tried to advise him to. They state  he has stated " I'd rather be dead if I have to do that." They tell me he wants to live life on his terms, and also tell me he did not want doctor or hospital care.   Discussed his current medications and CIWA protocol.  Educated daughter and sister on natural trajectory of illness, and expectations at EOL were discussed.  Questions and concerns addressed. Discussed concerns for symptom management needs. They are clear they want to bring him home, that "we take care of our own." They state they have nurse techs in their family and understand the possible risks of bringing him home. They state if he only has a week or two to live, they want him to be with family. Discussed that he is a candidate for the hospice facility and could have family there. They state he would want to be at home with family.    I completed a MOST form today through [facility] with daughter, and the signed original was placed in the chart. A photocopy was placed in the chart to be scanned into EMR. The patient outlined their wishes for the following treatment decisions:  Cardiopulmonary Resuscitation: Do Not Attempt Resuscitation (DNR/No CPR)  Medical Interventions:  Comfort Measures: Keep clean, warm, and dry. Use medication by any route, positioning, wound care, and other measures to relieve pain and suffering. Use oxygen, suction and manual treatment of airway obstruction as needed for comfort. Do not transfer to the hospital unless comfort needs cannot be met in current location.  Antibiotics: No antibiotics (use other measures to relieve symptoms)  IV Fluids: No IV fluids (provide other measures to ensure comfort)  Feeding Tube: No feeding tube        SUMMARY OF RECOMMENDATIONS  Plans already in place for comfort care and home with hospice tomorrow.    Recommend converting IV regimen to oral regimen today to see if he tolerates it.  --IV and oral Ativan are 1:1.  Recommend 2-71m of oral Ativan q 1 hour PRN for anxiety. --  Recommend oral Morphine concentrated solution 566mq 1 hours PRN for pain or dyspnea.  -- Recommend Robinul .61m9m 4 PRN for excessive secretions. -- Recommend continuing Scopolamine patch.    Prognosis:   Hours - Days      Primary Diagnoses: Present on Admission: . 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 CKD (chronic kidney disease), stage IIIa . Tobacco abuse . GERD (gastroesophageal reflux disease) . Hypertension   I have reviewed the medical record, interviewed the patient and family, and examined the patient. The following aspects are pertinent.  Past Medical History:  Diagnosis Date  . Cancer ([facility] . Hypertension    Social History   Socioeconomic History  . Marital status: Single    Spouse name: Not on file  . Number of children: Not on file  . Years of education: Not on file  . Highest education level: Not on file  Occupational History  . Not on file  Tobacco Use  . Smoking status: Current Every Day Smoker    Packs/day: 0.25    Years: 45.00    Pack years: 11.25    Types: Cigarettes  . Smokeless tobacco: Current User  Vaping Use  . Vaping Use: Never used  Substance and Sexual Activity  . Alcohol use: Yes    Alcohol/week: 7.0 standard drinks    Types: 7 Cans of beer per week    Comment: 1/2 pint a day of liquor  . Drug use: Yes    Types: Marijuana    Comment: Crack  . Sexual activity: Not Currently  Other Topics Concern  . Not on file  Social History Narrative  . Not on file   Social Determinants of Health   Financial Resource Strain:   . Difficulty of Paying Living Expenses: Not on file  Food Insecurity:   . Worried About RunCharity fundraiser the Last Year: Not on file  . Ran Out of Food in the Last Year: Not on file  Transportation Needs:   . Lack of Transportation (Medical): Not on file  . Lack of Transportation (Non-Medical):  Not on file  Physical Activity:   . Days of Exercise per Week: Not on file  . Minutes of Exercise per Session: Not on file  Stress:   . Feeling of Stress : Not on file  Social Connections:   . Frequency of Communication with Friends and Family: Not on file  . Frequency of Social Gatherings with Friends and Family: Not on file  . Attends Religious Services: Not on file  . Active Member of Clubs or Organizations: Not  on file  . Attends Archivist Meetings: Not on file  . Marital Status: Not on file   Family History  Problem Relation Age of Onset  . Diabetes Mellitus I Mother   . Pancreatic cancer Mother   . Stroke Mother   . Heart Problems Sister   . Thyroid disease Sister   . Hypertension Sister   . Cervical cancer Sister    Scheduled Meds: . scopolamine  1 patch Transdermal Q72H   Continuous Infusions: . sodium chloride 250 mL (12/03/19 0117)   PRN Meds:.sodium chloride, LORazepam, LORazepam **OR** LORazepam, morphine injection, ondansetron (ZOFRAN) IV Medications Prior to Admission:  Prior to Admission medications   Medication Sig Start Date End Date Taking? Authorizing Provider  dexamethasone (DECADRON) 2 MG tablet Take 1 tablet (2 mg total) by mouth 2 (two) times daily. Patient not taking: Reported on 07/17/2019 06/21/19   Noreene Filbert, MD  HYDROcodone-acetaminophen (NORCO/VICODIN) 5-325 MG tablet Take 1 tablet by mouth every 4 (four) hours as needed for severe pain. Patient not taking: Reported on 11/30/2019 08/22/19   [provider]  pantoprazole (PROTONIX) 20 MG tablet Take 1 tablet (20 mg total) by mouth daily. Patient not taking: Reported on 08/13/2019 07/17/19   Sindy Guadeloupe, MD  sucralfate (CARAFATE) 1 g tablet Take 1 tablet (1 g total) by mouth 3 (three) times daily. Dissolve in 3-4 tbsp warm water, swish and swallow. Patient not taking: Reported on 11/30/2019 10/24/19   Noreene Filbert, MD   Allergies  Allergen Reactions  . Pollen Extract  Other (See Comments)   Review of Systems  Unable to perform ROS   Physical Exam Constitutional:      Comments: Eyes closed.  Pulmonary:     Comments: Congestion noted in breathing.     Vital Signs: BP (!) 148/89 (BP Location: Left Arm)   Pulse (!) 113   Temp 98 F (36.7 C) (Axillary)   Resp (!) 24   Ht 6' 3.5" (1.918 m)   Wt 58 kg   SpO2 93%   BMI 15.76 kg/m  Pain Scale: Faces   Pain Score: 0-No pain   SpO2: SpO2: 93 % O2 Device:SpO2: 93 % O2 Flow Rate: .O2 Flow Rate (L/min): 5 L/min  IO: Intake/output summary:   Intake/Output Summary (Last 24 hours) at 12/04/2019 1328 Last data filed at 12/04/2019 1210 Gross per 24 hour  Intake --  Output 325 ml  Net -325 ml    LBM: Last BM Date: 11/29/19 Baseline Weight: Weight: 60.3 kg Most recent weight: Weight: 58 kg     Palliative Assessment/Data:     Time In: 1:40 Time Out: 2:40 Time Total: 60 min Greater than 50%  of this time was spent counseling and coordinating care related to the above assessment and plan.  Signed by: Asencion Gowda, NP   Please contact Palliative Medicine Team phone at [phone] for questions and concerns.  For individual provider: See Shea Evans

## 2019-12-04 NOTE — Plan of Care (Signed)
Patient alert to voice, remains drowsy and irritable. Medications given periodically r/t CWAL protocol and comfort care. Tele sitter remains at bedside. No cardiac tele monitoring or vital sign orders placed. Only comfort orders, patient scheduled to go home tomorrow with family under hospice services. Will continue to monitor.  Problem: Education: Goal: Knowledge of General Education information will improve Description: Including pain rating scale, medication(s)/side effects and non-pharmacologic comfort measures Outcome: Progressing   Problem: Health Behavior/Discharge Planning: Goal: Ability to manage health-related needs will improve Outcome: Progressing   Problem: Clinical Measurements: Goal: Will remain free from infection Outcome: Progressing

## 2019-12-04 NOTE — Progress Notes (Signed)
Chief Financial Officer note:  Visit made to new referral for [employer] services at home. Hospice eligibility ahs been confirmed. Patient's niece Chloe at bedside and aware of plan for hospice at discharge. DME to be ordered today. Patient continues to require IV lorazepam and morphne for symptom management. Per discussion with TOC Roseanne Reno, plan is for discharge home tomorrow via nonemergent transport with signed out of facility DNR in place.  Hospice information and contact numbers given to Chloe.  Liaison will follow through discharge. Flo Shanks BSN, RN, [place] [phone]

## 2019-12-04 NOTE — Telephone Encounter (Signed)
Per Horris Latino, patient does not have a PCP so she will send orders to Dr Janese Banks to sign

## 2019-12-04 NOTE — Progress Notes (Signed)
PROGRESS NOTE    Tyler Holloway  HCW:[id number] DOB: 10/28/1960 DOA: 11/30/2019 PCP: Patient, No Pcp Per     Brief Narrative:  Tyler Holloway is a 59 y.o. 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 Pt states that he has been having cough and congestion for several days, which has been progressively worsening. He has mild shortness of breath and mild pleuritic chest pain. Patient has chills and fever with temperature 101 in ED. Chest x-ray showed left lower lobe infiltration.  Patient underwent treatment for pneumonia.  He was evaluated by speech-language pathology, determined to be high risk aspiration.  He was made n.p.o.  On 11/6, he started to go into alcohol withdrawal.  He was treated with CIWA protocol and IV Ativan. His respiratory status continued to worsen. After discussion with multiple family members regarding his aspiration risk, NPO status, need for alternative means of nutrition such as PEG (unable to place cortrak due to continued agitation), patient's prior refusal for feeding tube, and alcohol withdrawal with delirium, family elected to take patient home with home hospice.   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  He was recommended for n.p.o. status, alternate means of nutrition.  He is also going through alcohol withdrawal, remains encephalopathic.  Discussed with multiple family members, they note that patient would never have wanted a feeding tube.  Even if he did wish for feeding tube, he remains a very poor candidate due to encephalopathy and respiratory failure.  He is not a candidate for cortrak due to his confusion and agitation. After discussion with family members, they decided to transition to comfort care and home hospice.  TOC notified.    Code Status: DNR Family Communication: No family at bedside today  Disposition Plan:  Status is: Inpatient  Remains inpatient appropriate because:Unsafe d/c plan   Dispo: The patient is from: Home              Anticipated d/c is to: Home hospice              Anticipated d/c date is: 1 day              Patient currently waiting for home hospice. He may discharge to family care once home hospice is set up in next 24 hours and need for CIWA/IV ativan has decreased.    Consultants:   None   Procedures:   None  Antimicrobials:  Anti-infectives (From admission, onward)   Start     Dose/Rate Route Frequency Ordered Stop   12/02/19 1000  ceFEPIme (MAXIPIME) 2 g in sodium chloride 0.9 % 100 mL IVPB  Status:  Discontinued        2 g 200 mL/hr over 30 Minutes Intravenous Every 8 hours 12/02/19 0933 12/03/19 1203   12/02/19 0400  meropenem (MERREM) 1 g in sodium chloride 0.9 % 100 mL IVPB  Status:  Discontinued        1 g 200 mL/hr over 30 Minutes Intravenous Every 8 hours 12/02/19 0355 12/02/19 0933   12/02/19 0100  metroNIDAZOLE (FLAGYL) IVPB 500 mg  Status:  Discontinued        500 mg 100 mL/hr over 60 Minutes Intravenous Every 8 hours 12/02/19 0002 12/02/19 0424   12/01/19 0800  vancomycin (VANCOREADY) IVPB 500 mg/100 mL  Status:   Discontinued        500 mg 100 mL/hr over 60 Minutes Intravenous Every 12 hours 11/30/19 1841 12/01/19 0736   11/30/19 1930  ceFEPIme (MAXIPIME) 2 g in sodium chloride 0.9 % 100 mL IVPB  Status:  Discontinued        2 g 200 mL/hr over 30 Minutes Intravenous Every 12 hours 11/30/19 1841 12/02/19 0352   11/30/19 1930  vancomycin (VANCOREADY) IVPB 1500 mg/300 mL        1,500 mg 150 mL/hr over 120 Minutes Intravenous  Once 11/30/19 1841 12/01/19 0128   11/30/19 1145  cefTRIAXone (ROCEPHIN) 2 g in sodium chloride 0.9 % 100 mL IVPB  Status:  Discontinued        2 g 200 mL/hr over 30 Minutes Intravenous Every 24 hours 11/30/19 1144 11/30/19 1830   11/30/19 1145  azithromycin (ZITHROMAX) 500 mg in sodium chloride 0.9 % 250 mL IVPB  Status:  Discontinued        500 mg 250 mL/hr over 60 Minutes Intravenous Every 24 hours 11/30/19 1144 11/30/19 1830       Objective: Vitals:   12/03/19 0700 12/03/19 0800 12/03/19 1725 12/04/19 0821  BP: (!) 147/91 (!) 147/91 (!) 170/96 (!) 148/89  Pulse: (!) 115 (!) 115 (!) 117 (!) 113  Resp:  (!) 29 (!) 24 (!) 24  Temp: 98.5 F (36.9 C) 98.6 F (37 C) 97.8 F (36.6 C) 98 F (36.7 C)  TempSrc: Axillary  Oral Axillary  SpO2:  92%  93%  Weight:      Height:       No intake or output data in the 24 hours ending 12/04/19 1141 Filed Weights   11/30/19 1026 12/01/19 0400 12/02/19 0625  Weight: 60.3 kg 57.9 kg 58 kg    Examination: General exam: Appears obtunded    Data Reviewed: I have personally reviewed following labs and imaging studies  CBC: Recent Labs  Lab 11/29/19 1635 11/30/19 1041 12/01/19 0514 12/02/19 0510 12/03/19 0627  WBC 20.4* 20.4* 14.7* 13.3* 14.6*  NEUTROABS 16.0* 18.1*  --   --   --   HGB 12.1* 12.3* 11.6* 11.4* 11.2*  HCT 37.2* 36.8* 36.2* 34.9* 34.3*  MCV 91.6 90.2 93.5 91.4 92.0  PLT 344 329 299 326 163   Basic Metabolic Panel: Recent Labs  Lab 11/29/19 1635 11/30/19 1041 12/01/19 0514 12/02/19 0510  12/03/19 0627  NA 133* 134* 138 138 143  K 3.8 3.6 3.9 3.8 3.6  CL 93* 94* 102 103 108  CO2 28 26 24 24 26   GLUCOSE 125* 134* 100* 103* 103*  BUN 19 22* 22* 23* 23*  CREATININE 1.39* 1.33* 1.23 0.88 0.93  CALCIUM 9.5 9.2 8.9 9.3 9.5   GFR: Estimated Creatinine Clearance: 69.3 mL/min (by C-G formula based on SCr  of 0.93 mg/dL). Liver Function Tests: Recent Labs  Lab 11/29/19 1635 11/30/19 1041  AST 68* 77*  ALT 33 40  ALKPHOS 114 107  BILITOT 2.0* 1.8*  PROT 7.9 7.5  ALBUMIN 3.2* 2.9*   No results for input(s): LIPASE, AMYLASE in the last 168 hours. No results for input(s): AMMONIA in the last 168 hours. Coagulation Profile: No results for input(s): INR, PROTIME in the last 168 hours. Cardiac Enzymes: No results for input(s): CKTOTAL, CKMB, CKMBINDEX, TROPONINI in the last 168 hours. BNP (last 3 results) No results for input(s): PROBNP in the last 8760 hours. HbA1C: No results for input(s): HGBA1C in the last 72 hours. CBG: No results for input(s): GLUCAP in the last 168 hours. Lipid Profile: No results for input(s): CHOL, HDL, LDLCALC, TRIG, CHOLHDL, LDLDIRECT in the last 72 hours. Thyroid Function Tests: No results for input(s): TSH, T4TOTAL, FREET4, T3FREE, THYROIDAB in the last 72 hours. Anemia Panel: No results for input(s): VITAMINB12, FOLATE, FERRITIN, TIBC, IRON, RETICCTPCT in the last 72 hours. Sepsis Labs: Recent Labs  Lab 11/29/19 1635 11/30/19 1041 11/30/19 1229 11/30/19 1433  LATICACIDVEN 1.7 2.1* 2.0* 1.6    Recent Results (from the past 240 hour(s))  Culture, blood (single)     Status: None (Preliminary result)   Collection Time: 11/30/19 12:29 PM   Specimen: BLOOD  Result Value Ref Range Status   Specimen Description BLOOD BLOOD LEFT FOREARM  Final   Special Requests   Final    Blood Culture results may not be optimal due to an excessive volume of blood received in culture bottles   Culture   Final    NO GROWTH 4 DAYS Performed at [facility], [address], [place], [place] [address]    Report Status PENDING  Incomplete  Respiratory Panel by RT PCR (Flu A&B, Covid) - Nasopharyngeal Swab     Status: None   Collection Time: 11/30/19 12:48 PM   Specimen: Nasopharyngeal Swab  Result Value Ref Range Status   SARS Coronavirus 2 by RT PCR NEGATIVE NEGATIVE Final    Comment: (NOTE) SARS-CoV-2 target nucleic acids are NOT DETECTED.  The SARS-CoV-2 RNA is generally detectable in upper respiratoy specimens during the acute phase of infection. The lowest concentration of SARS-CoV-2 viral copies this assay can detect is 131 copies/mL. A negative result does not preclude SARS-Cov-2 infection and should not be used as the sole basis for treatment or other patient management decisions. A negative result may occur with  improper specimen collection/handling, submission of specimen other than nasopharyngeal swab, presence of viral mutation(s) within the areas targeted by this assay, and inadequate number of viral copies (<131 copies/mL). A negative result must be combined with clinical observations, patient history, and epidemiological information. The expected result is Negative.  Fact Sheet for Patients:  PinkCheek.be  Fact Sheet for Healthcare Providers:  GravelBags.it  This test is no t yet approved or cleared by the [place] FDA and  has been authorized for detection and/or diagnosis of SARS-CoV-2 by FDA under an Emergency Use Authorization (EUA). This EUA will remain  in effect (meaning this test can be used) for the duration of the COVID-19 declaration under Section 564(b)(1) of the Act, 21 U.S.C. section 360bbb-3(b)(1), unless the authorization is terminated or revoked sooner.     Influenza A by PCR NEGATIVE NEGATIVE Final   Influenza B by PCR NEGATIVE NEGATIVE Final    Comment: (NOTE) The Xpert Xpress SARS-CoV-2/FLU/RSV assay is intended as an  aid in  the diagnosis of influenza from Nasopharyngeal swab specimens and  should not be used as a sole basis for treatment. Nasal washings and  aspirates are unacceptable for Xpert Xpress SARS-CoV-2/FLU/RSV  testing.  Fact Sheet for Patients: PinkCheek.be  Fact Sheet for Healthcare Providers: GravelBags.it  This test is not yet approved or cleared by the [place] FDA and  has been authorized for detection and/or diagnosis of SARS-CoV-2 by  FDA under an Emergency Use Authorization (EUA). This EUA will remain  in effect (meaning this test can be used) for the duration of the  Covid-19 declaration under Section 564(b)(1) of the Act, 21  U.S.C. section 360bbb-3(b)(1), unless the authorization is  terminated or revoked. Performed at [facility], [address] [place], [place] [address]   Expectorated sputum assessment w rflx to resp cult     Status: None   Collection Time: 11/30/19 12:48 PM   Specimen: Sputum  Result Value Ref Range Status   Specimen Description SPU EXPECTORATED SPUTUM  Final   Special Requests NONE  Final   Sputum evaluation   Final    THIS SPECIMEN IS ACCEPTABLE FOR SPUTUM CULTURE Performed at [facility], [address] [place], [place] [address]    Report Status 11/30/2019 FINAL  Final  Culture, respiratory     Status: None   Collection Time: 11/30/19 12:48 PM   Specimen: Sputum  Result Value Ref Range Status   Specimen Description   Final    SPU EXPECTORATED SPUTUM Performed at [facility], [address] [place], [place] [address]    Special Requests   Final    NONE Reflexed from [id number] Performed at [facility], [address] [place], [place] [address]    Gram Stain   Final    ABUNDANT WBC PRESENT,BOTH PMN AND MONONUCLEAR ABUNDANT GRAM POSITIVE COCCI ABUNDANT GRAM NEGATIVE RODS MODERATE GRAM POSITIVE RODS RARE YEAST    Culture   Final     Normal respiratory flora-no Staph aureus or Pseudomonas seen Performed at [facility], [place] [address], [place], [place] [address]    Report Status 12/02/2019 FINAL  Final  MRSA PCR Screening     Status: None   Collection Time: 11/30/19 10:15 PM   Specimen: Nasopharyngeal  Result Value Ref Range Status   MRSA by PCR NEGATIVE NEGATIVE Final    Comment:        The GeneXpert MRSA Assay (FDA approved for NASAL specimens only), is one component of a comprehensive MRSA colonization surveillance program. It is not intended to diagnose MRSA infection nor to guide or monitor treatment for MRSA infections. Performed at [facility], [address] [place], [place] [address]       Radiology Studies: No results found.    Scheduled Meds: . scopolamine  1 patch Transdermal Q72H   Continuous Infusions: . sodium chloride 250 mL (12/03/19 0117)     LOS: 4 days      Time spent: 25 minutes   Dessa Phi, DO [facility] 12/04/2019, 11:41 AM   Available via Epic secure chat 7am-7pm After these hours, please refer to coverage provider listed on [email or web address]

## 2019-12-04 NOTE — Telephone Encounter (Signed)
If he doesn't have pcp, I can be the hospice attending. Otherwise prefer pcp to be his attendign as his hospice diagnosis not directly related to his malignancy

## 2019-12-04 NOTE — Telephone Encounter (Signed)
Patient has been referred to hospice and they are asking if Dr Janese Banks will serve as attending. Please advise.  Patient is currently admitted to hospital.

## 2019-12-04 NOTE — TOC Progression Note (Signed)
Transition of Care [facility]) - Progression Note    Patient Details  Name: JODECI ROARTY MRN: [id number] Date of Birth: 03/06/1960  Transition of Care [facility]) CM/SW [place], RN Phone Number: 12/04/2019, 10:09 AM  Clinical Narrative:     Spoke with Family, they would like patient to discharge home with hospicewhen patient is not requiring hourly CIWA medications.  Patient will not have an IV at home.   Spoke with Santiago Glad with [facility] hosp[facility], she will have equipment delivered today.    Plan is for Tuesday discharge.    Barriers to Discharge: Continued Medical Work up, Facility will not accept until restraint criteria met (CIWA hourly iv meds)  Expected Discharge Plan and Services                           DME Arranged: Hospice Equipment Package A DME Agency:  ([place]) Date DME Agency Contacted: 12/04/19 Time DME Agency Contacted: 1008 Representative spoke with at DME Agency: Santiago Glad HH Arranged: RN, Nurse's Aide, Social Work [employer] Agency:  Lonia Chimera) Date [place]: 12/04/19 Time [place]: 46 Representative spoke with at [place]: n   Social Determinants of Health ([place]) Interventions    Readmission Risk Interventions No flowsheet data found.

## 2019-12-05 ENCOUNTER — Inpatient Hospital Stay: Payer: BC Managed Care – PPO

## 2019-12-05 ENCOUNTER — Other Ambulatory Visit: Payer: BC Managed Care – PPO

## 2019-12-05 LAB — CULTURE, BLOOD (SINGLE): Culture: NO GROWTH

## 2019-12-05 MED ORDER — LORAZEPAM 1 MG PO TABS: 2.0000 mg | ORAL_TABLET | ORAL | 0 refills | Status: AC | PRN

## 2019-12-05 MED ORDER — GLYCOPYRROLATE 1 MG PO TABS: 1.0000 mg | ORAL_TABLET | ORAL | 0 refills | Status: AC | PRN

## 2019-12-05 MED ORDER — SCOPOLAMINE 1 MG/3DAYS TD PT72: 1.0000 | MEDICATED_PATCH | TRANSDERMAL | 0 refills | Status: AC

## 2019-12-05 MED ORDER — MORPHINE SULFATE (CONCENTRATE) 10 MG /0.5 ML PO SOLN: 5.0000 mg | ORAL | 0 refills | Status: AC | PRN

## 2019-12-05 NOTE — Discharge Summary (Signed)
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 Pt states that he hasbeen having cough andcongestion for several days, which has been progressively worsening.He hasmild shortness of breathand mildpleuritic chest pain.Patient has chills and fever with temperature 101 in ED.Chest x-ray showed left lower lobe infiltration.  Patient underwent treatment for pneumonia.  He was evaluated by speech-language pathology, determined to be high risk aspiration.  He was made n.p.o.  On 11/6, he started to go into alcohol withdrawal.  He was treated with CIWA protocol and IV Ativan. His respiratory status continued to worsen. After discussion with multiple family members regarding his aspiration risk, NPO status, need for alternative means of nutrition such as PEG (unable to place cortrak due to continued agitation), patient's prior refusal for feeding tube, and alcohol withdrawal with delirium, family elected to take patient home with home hospice.   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, laryngeal aspect (HCC)   Severe sepsis (HCC)   CKD (chronic kidney disease), stage IIIa   Tobacco abuse   Alcohol use   GERD (gastroesophageal reflux disease)   Hypertension   Protein-calorie malnutrition, severe   Dysphagia   Acute hypoxemic respiratory failure (HCC)   Alcohol abuse   Alcohol withdrawal ([place])   DNR (do  not resuscitate)   End of life care   Discharge Instructions   Allergies as of 12/05/2019      Reactions   Pollen Extract Other (See Comments)      Medication List    STOP taking these medications   dexamethasone 2 MG tablet Commonly known as: DECADRON   HYDROcodone-acetaminophen 5-325 MG tablet Commonly known as: NORCO/VICODIN   pantoprazole 20 MG tablet Commonly known as: Protonix   sucralfate 1 g tablet Commonly known as: Carafate     TAKE these medications   glycopyrrolate 1 MG tablet Commonly known as: Robinul Take 1 tablet (1 mg total) by mouth every 4 (four) hours as needed (secretions).   LORazepam 1 MG tablet Commonly known as: Ativan Take 2 tablets (2 mg total) by mouth every hour as needed for up to 7 days for anxiety.   morphine CONCENTRATE 10 mg / 0.5 ml concentrated solution Place 0.25 mLs (5 mg total) under the tongue every hour as needed for moderate pain, severe pain or shortness of breath.   scopolamine 1 MG/3DAYS Commonly known as: Transderm-Scop (1.5 MG) Place 1 patch (1.5 mg total) onto the skin every 3 (three) days.       Allergies  Allergen Reactions  . Pollen Extract Other (See Comments)    Consultations:  Palliative care   Procedures/Studies: DG Chest 2 View  Result Date: 11/29/2019 CLINICAL DATA:  Wheezing. EXAM: CHEST - 2 VIEW COMPARISON:  None. FINDINGS: The heart size and mediastinal contours are within normal limits. Hyperexpansion of the lungs is noted. No pneumothorax or pleural effusion is noted. Mild right basilar subsegmental atelectasis or scarring is noted. Left lower lobe airspace opacity is noted concerning for pneumonia. The visualized skeletal structures  are unremarkable. IMPRESSION: Left lower lobe pneumonia. Mild right basilar subsegmental atelectasis or scarring. Hyperexpansion of the lungs. Electronically Signed   By: Marijo Conception M.D.   On: 11/29/2019 14:44   DG Chest Port 1 View  Result Date:  12/01/2019 CLINICAL DATA:  Congestion. Pneumonia. Cough, fatigue and weakness. EXAM: PORTABLE CHEST 1 VIEW COMPARISON:  Chest radiograph 11/29/2019.  CT 08/13/2019 FINDINGS: Progression of patchy bilateral basilar lung opacities over the past 2 days. This is significantly increased on the right. Normal heart size and mediastinal contours. No significant pleural effusion. No pneumothorax. No pulmonary edema. No acute osseous abnormalities are seen. Patient's chin obscures the lung apices. IMPRESSION: Progression of patchy bilateral basilar lung opacities over the past 2 days. Findings suspicious for pneumonia, including aspiration. Electronically Signed   By: Keith Rake M.D.   On: 12/01/2019 23:32       Discharge Exam: Vitals:   12/04/19 2033 12/05/19 0805  BP: (!) 148/84 (!) 155/99  Pulse: (!) 115 100  Resp: (!) 21 16  Temp: 98.4 F (36.9 C) 97.9 F (36.6 C)  SpO2: (!) 70%     General: Pt is alert to voice   The results of significant diagnostics from this hospitalization (including imaging, microbiology, ancillary and laboratory) are listed below for reference.     Microbiology: Recent Results (from the past 240 hour(s))  Culture, blood (single)     Status: None   Collection Time: 11/30/19 12:29 PM   Specimen: BLOOD  Result Value Ref Range Status   Specimen Description BLOOD BLOOD LEFT FOREARM  Final   Special Requests   Final    Blood Culture results may not be optimal due to an excessive volume of blood received in culture bottles   Culture   Final    NO GROWTH 5 DAYS Performed at [facility], [address] [place], [place] [address]    Report Status 12/05/2019 FINAL  Final  Respiratory Panel by RT PCR (Flu A&B, Covid) - Nasopharyngeal Swab     Status: None   Collection Time: 11/30/19 12:48 PM   Specimen: Nasopharyngeal Swab  Result Value Ref Range Status   SARS Coronavirus 2 by RT PCR NEGATIVE NEGATIVE Final    Comment: (NOTE) SARS-CoV-2 target nucleic  acids are NOT DETECTED.  The SARS-CoV-2 RNA is generally detectable in upper respiratoy specimens during the acute phase of infection. The lowest concentration of SARS-CoV-2 viral copies this assay can detect is 131 copies/mL. A negative result does not preclude SARS-Cov-2 infection and should not be used as the sole basis for treatment or other patient management decisions. A negative result may occur with  improper specimen collection/handling, submission of specimen other than nasopharyngeal swab, presence of viral mutation(s) within the areas targeted by this assay, and inadequate number of viral copies (<131 copies/mL). A negative result must be combined with clinical observations, patient history, and epidemiological information. The expected result is Negative.  Fact Sheet for Patients:  PinkCheek.be  Fact Sheet for Healthcare Providers:  GravelBags.it  This test is no t yet approved or cleared by the [place] FDA and  has been authorized for detection and/or diagnosis of SARS-CoV-2 by FDA under an Emergency Use Authorization (EUA). This EUA will remain  in effect (meaning this test can be used) for the duration of the COVID-19 declaration under Section 564(b)(1) of the Act, 21 U.S.C. section 360bbb-3(b)(1), unless the authorization is terminated or revoked sooner.     Influenza A by PCR NEGATIVE  NEGATIVE Final   Influenza B by PCR NEGATIVE NEGATIVE Final    Comment: (NOTE) The Xpert Xpress SARS-CoV-2/FLU/RSV assay is intended as an aid in  the diagnosis of influenza from Nasopharyngeal swab specimens and  should not be used as a sole basis for treatment. Nasal washings and  aspirates are unacceptable for Xpert Xpress SARS-CoV-2/FLU/RSV  testing.  Fact Sheet for Patients: PinkCheek.be  Fact Sheet for Healthcare Providers: GravelBags.it  This test  is not yet approved or cleared by the [place] FDA and  has been authorized for detection and/or diagnosis of SARS-CoV-2 by  FDA under an Emergency Use Authorization (EUA). This EUA will remain  in effect (meaning this test can be used) for the duration of the  Covid-19 declaration under Section 564(b)(1) of the Act, 21  U.S.C. section 360bbb-3(b)(1), unless the authorization is  terminated or revoked. Performed at [facility], [address] [place], [place] [address]   Expectorated sputum assessment w rflx to resp cult     Status: None   Collection Time: 11/30/19 12:48 PM   Specimen: Sputum  Result Value Ref Range Status   Specimen Description SPU EXPECTORATED SPUTUM  Final   Special Requests NONE  Final   Sputum evaluation   Final    THIS SPECIMEN IS ACCEPTABLE FOR SPUTUM CULTURE Performed at [facility], [address] [place], [place] [address]    Report Status 11/30/2019 FINAL  Final  Culture, respiratory     Status: None   Collection Time: 11/30/19 12:48 PM   Specimen: Sputum  Result Value Ref Range Status   Specimen Description   Final    SPU EXPECTORATED SPUTUM Performed at [facility], [address], [place], [place] [address]    Special Requests   Final    NONE Reflexed from [id number] Performed at [facility], [address] [place], [place] [address]    Gram Stain   Final    ABUNDANT WBC PRESENT,BOTH PMN AND MONONUCLEAR ABUNDANT GRAM POSITIVE COCCI ABUNDANT GRAM NEGATIVE RODS MODERATE GRAM POSITIVE RODS RARE YEAST    Culture   Final    Normal respiratory flora-no Staph aureus or Pseudomonas seen Performed at [facility], [place] [address] [place],  [address]    Report Status 12/02/2019 FINAL  Final  MRSA PCR Screening     Status: None   Collection Time: 11/30/19 10:15 PM   Specimen: Nasopharyngeal  Result Value Ref Range Status   MRSA by PCR NEGATIVE NEGATIVE Final    Comment:        The  GeneXpert MRSA Assay (FDA approved for NASAL specimens only), is one component of a comprehensive MRSA colonization surveillance program. It is not intended to diagnose MRSA infection nor to guide or monitor treatment for MRSA infections. Performed at [facility], [address] [place],  [address]      Labs: BNP (last 3 results) No results for input(s): BNP in the last 8760 hours. Basic Metabolic Panel: Recent Labs  Lab 11/29/19 1635 11/30/19 1041 12/01/19 0514 12/02/19 0510 12/03/19 0627  NA 133* 134* 138 138 143  K 3.8 3.6 3.9 3.8 3.6  CL 93* 94* 102 103 108  CO2 28 26 24 24 26   GLUCOSE 125* 134* 100* 103* 103*  BUN 19 22* 22* 23* 23*  CREATININE 1.39* 1.33* 1.23 0.88 0.93  CALCIUM 9.5 9.2 8.9 9.3 9.5   Liver Function Tests: Recent Labs  Lab 11/29/19 1635 11/30/19 1041  AST 68* 77*  ALT 33 40  ALKPHOS 114 107  BILITOT 2.0* 1.8*  PROT 7.9 7.5  ALBUMIN 3.2* 2.9*   No results for input(s): LIPASE, AMYLASE in the last 168 hours. No results for input(s): AMMONIA in the last 168 hours. CBC: Recent Labs  Lab 11/29/19 1635 11/30/19 1041 12/01/19 0514 12/02/19 0510 12/03/19 0627  WBC 20.4* 20.4* 14.7* 13.3* 14.6*  NEUTROABS 16.0* 18.1*  --   --   --   HGB 12.1* 12.3* 11.6* 11.4* 11.2*  HCT 37.2* 36.8* 36.2* 34.9* 34.3*  MCV 91.6 90.2 93.5 91.4 92.0  PLT 344 329 299 326 355   Cardiac Enzymes: No results for input(s): CKTOTAL, CKMB, CKMBINDEX, TROPONINI in the last 168 hours. BNP: Invalid input(s): POCBNP CBG: No results for input(s): GLUCAP in the last 168 hours. D-Dimer No results for input(s): DDIMER in the last 72 hours. Hgb A1c No results for input(s): HGBA1C in the last 72 hours. Lipid Profile No results for input(s): CHOL, HDL, LDLCALC, TRIG, CHOLHDL, LDLDIRECT in the last 72 hours. Thyroid function studies No results for input(s): TSH, T4TOTAL, T3FREE, THYROIDAB in the last 72 hours.  Invalid input(s): FREET3 Anemia work  up No results for input(s): VITAMINB12, FOLATE, FERRITIN, TIBC, IRON, RETICCTPCT in the last 72 hours. Urinalysis    Component Value Date/Time   COLORURINE AMBER (A) 11/30/2019 1800   APPEARANCEUR HAZY (A) 11/30/2019 1800   APPEARANCEUR Clear 09/11/2015 1020   LABSPEC 1.028 11/30/2019 1800   PHURINE 5.0 11/30/2019 1800   GLUCOSEU NEGATIVE 11/30/2019 1800   HGBUR MODERATE (A) 11/30/2019 1800   BILIRUBINUR NEGATIVE 11/30/2019 1800   BILIRUBINUR Negative 09/11/2015 1020   KETONESUR NEGATIVE 11/30/2019 1800   PROTEINUR 100 (A) 11/30/2019 1800   NITRITE NEGATIVE 11/30/2019 1800   LEUKOCYTESUR NEGATIVE 11/30/2019 1800   Sepsis Labs Invalid input(s): PROCALCITONIN,  WBC,  LACTICIDVEN Microbiology Recent Results (from the past 240 hour(s))  Culture, blood (single)     Status: None   Collection Time: 11/30/19 12:29 PM   Specimen: BLOOD  Result Value Ref Range Status   Specimen Description BLOOD BLOOD LEFT FOREARM  Final   Special Requests   Final    Blood Culture results may not be optimal due to an excessive volume of blood received in culture bottles   Culture   Final    NO GROWTH 5 DAYS Performed at [facility], [address], [place], [place] [address]    Report Status 12/05/2019 FINAL  Final  Respiratory Panel by RT PCR (Flu A&B, Covid) - Nasopharyngeal Swab     Status: None   Collection Time: 11/30/19 12:48 PM   Specimen: Nasopharyngeal Swab  Result Value Ref Range Status   SARS Coronavirus 2 by RT PCR NEGATIVE NEGATIVE Final    Comment: (NOTE) SARS-CoV-2 target nucleic acids are NOT DETECTED.  The SARS-CoV-2 RNA is generally detectable in upper respiratoy specimens during the acute phase of infection. The lowest concentration of SARS-CoV-2 viral copies this assay can detect is 131 copies/mL. A negative result does not preclude SARS-Cov-2 infection and should not be used as the sole basis for treatment or other patient management decisions. A negative result  may occur with  improper specimen collection/handling, submission of specimen other than nasopharyngeal swab, presence of viral mutation(s) within the areas targeted by this assay, and inadequate number of viral copies (<131 copies/mL). A negative result must be combined with clinical observations, patient history, and epidemiological information. The expected result is Negative.  Fact Sheet  for Patients:  PinkCheek.be  Fact Sheet for Healthcare Providers:  GravelBags.it  This test is no t yet approved or cleared by the [place] FDA and  has been authorized for detection and/or diagnosis of SARS-CoV-2 by FDA under an Emergency Use Authorization (EUA). This EUA will remain  in effect (meaning this test can be used) for the duration of the COVID-19 declaration under Section 564(b)(1) of the Act, 21 U.S.C. section 360bbb-3(b)(1), unless the authorization is terminated or revoked sooner.     Influenza A by PCR NEGATIVE NEGATIVE Final   Influenza B by PCR NEGATIVE NEGATIVE Final    Comment: (NOTE) The Xpert Xpress SARS-CoV-2/FLU/RSV assay is intended as an aid in  the diagnosis of influenza from Nasopharyngeal swab specimens and  should not be used as a sole basis for treatment. Nasal washings and  aspirates are unacceptable for Xpert Xpress SARS-CoV-2/FLU/RSV  testing.  Fact Sheet for Patients: PinkCheek.be  Fact Sheet for Healthcare Providers: GravelBags.it  This test is not yet approved or cleared by the [place] FDA and  has been authorized for detection and/or diagnosis of SARS-CoV-2 by  FDA under an Emergency Use Authorization (EUA). This EUA will remain  in effect (meaning this test can be used) for the duration of the  Covid-19 declaration under Section 564(b)(1) of the Act, 21  U.S.C. section 360bbb-3(b)(1), unless the authorization is  terminated  or revoked. Performed at [facility], [address] [place], [place] [address]   Expectorated sputum assessment w rflx to resp cult     Status: None   Collection Time: 11/30/19 12:48 PM   Specimen: Sputum  Result Value Ref Range Status   Specimen Description SPU EXPECTORATED SPUTUM  Final   Special Requests NONE  Final   Sputum evaluation   Final    THIS SPECIMEN IS ACCEPTABLE FOR SPUTUM CULTURE Performed at [facility] [address], [place], [place] [address]    Report Status 11/30/2019 FINAL  Final  Culture, respiratory     Status: None   Collection Time: 11/30/19 12:48 PM   Specimen: Sputum  Result Value Ref Range Status   Specimen Description   Final    SPU EXPECTORATED SPUTUM Performed at [facility], [address] [place], [place] [address]    Special Requests   Final    NONE Reflexed from [id number] Performed at [facility], [address] [place], [place] [address]    Gram Stain   Final    ABUNDANT WBC PRESENT,BOTH PMN AND MONONUCLEAR ABUNDANT GRAM POSITIVE COCCI ABUNDANT GRAM NEGATIVE RODS MODERATE GRAM POSITIVE RODS RARE YEAST    Culture   Final    Normal respiratory flora-no Staph aureus or Pseudomonas seen Performed at [facility], [place] [address], [place], [place] [address]    Report Status 12/02/2019 FINAL  Final  MRSA PCR Screening     Status: None   Collection Time: 11/30/19 10:15 PM   Specimen: Nasopharyngeal  Result Value Ref Range Status   MRSA by PCR NEGATIVE NEGATIVE Final    Comment:        The GeneXpert MRSA Assay (FDA approved for NASAL specimens only), is one component of a comprehensive MRSA colonization surveillance program. It is not intended to diagnose MRSA infection nor to guide or monitor treatment for MRSA infections. Performed at [facility], [address], [place], [place] [address]      Patient was seen and examined on the day of discharge  and was found to  be in stable condition. Time coordinating discharge: 35 minutes including assessment and coordination of care, as well as examination of the patient.   SIGNED:  Dessa Phi, DO [facility] 12/05/2019, 10:24 AM

## 2019-12-05 NOTE — TOC Transition Note (Signed)
Transition of Care [facility]) - CM/SW Discharge Note   Patient Details  Name: Tyler Holloway MRN: [id number] Date of Birth: 21-Jun-1960  [facility]) CM/SW Contact:  Kerin Salen, RN Phone Number: 12/05/2019, 11:49 AM   Clinical Narrative:   Patient to be discharged today by [facility] to home with sister under Hospice Care. Sister Tyler Holloway notified and no other barriers identified at this time.    Final next level of care: Home w Hospice Care Barriers to Discharge: Barriers Resolved   Patient Goals and CMS Choice   CMS Medicare.gov Compare Post Acute Care list provided to:: Patient Choice offered to / list presented to : Patient, Sibling  Discharge Placement                  Name of family member notified: Tyler Holloway, patients sister Patient and family notified of of transfer: 12/05/19  Discharge Plan and Services                DME Arranged: Hospice Equipment Package A DME Agency:  ([place]) Date DME Agency Contacted: 12/04/19 Time DME Agency Contacted: [phone] Representative spoke with at DME Agency: Santiago Glad HH Arranged: RN, Nurse's Aide, Social Work [employer] Agency:  Lonia Chimera) Date [place]: 12/04/19 Time [place]: 63 Representative spoke with at [place]: n  Social Determinants of Health ([place]) Interventions     Readmission Risk Interventions No flowsheet data found.

## 2019-12-05 NOTE — Progress Notes (Signed)
[facility] hospital liaison note:  Follow up visit to new referral for [facility] hospice services at home and telephone call to patient's sister Malachy Mood. Plan is for discharge home today. Malachy Mood is aware and remains in agreement. Prescriptions to be e-scripted to [facility] on [address]. Hospice admission is planned for tomorrow 11/10 at 9 am. All comfort medications discussed with Attending physician Dr. Maylene Roes. Referral updated to scheduled discharge.  Flo Shanks BSN, RN, [place] [phone]

## 2019-12-05 NOTE — Plan of Care (Signed)
Patient discharged at this time via stretcher by EMS services to home with family with hospice care with all belongings. Patient responsive to voice only. Disoriented x4. PIVs removed and external catheter and suction equipment.  Problem: Education: Goal: Knowledge of General Education information will improve Description: Including pain rating scale, medication(s)/side effects and non-pharmacologic comfort measures Outcome: Completed/Met   Problem: Health Behavior/Discharge Planning: Goal: Ability to manage health-related needs will improve Outcome: Completed/Met   Problem: Clinical Measurements: Goal: Ability to maintain clinical measurements within normal limits will improve Outcome: Completed/Met Goal: Will remain free from infection Outcome: Completed/Met Goal: Diagnostic test results will improve Outcome: Completed/Met Goal: Respiratory complications will improve Outcome: Completed/Met Goal: Cardiovascular complication will be avoided Outcome: Completed/Met   Problem: Activity: Goal: Risk for activity intolerance will decrease Outcome: Completed/Met   Problem: Nutrition: Goal: Adequate nutrition will be maintained Outcome: Completed/Met   Problem: Coping: Goal: Level of anxiety will decrease Outcome: Completed/Met   Problem: Elimination: Goal: Will not experience complications related to bowel motility Outcome: Completed/Met Goal: Will not experience complications related to urinary retention Outcome: Completed/Met   Problem: Pain Managment: Goal: General experience of comfort will improve Outcome: Completed/Met   Problem: Safety: Goal: Ability to remain free from injury will improve Outcome: Completed/Met   Problem: Skin Integrity: Goal: Risk for impaired skin integrity will decrease Outcome: Completed/Met

## 2019-12-07 ENCOUNTER — Telehealth: Payer: Self-pay | Admitting: *Deleted

## 2019-12-07 ENCOUNTER — Inpatient Hospital Stay: Payer: BC Managed Care – PPO | Admitting: Oncology

## 2019-12-11 ENCOUNTER — Telehealth: Payer: Self-pay | Admitting: *Deleted

## 2019-12-11 NOTE — Telephone Encounter (Signed)
Hassan Rowan called to say that standard insurance still even though he passed away 12-11-2022. Filled out form and sent in from 11/2 to 12-11-2019 when pt passed away

## 2019-12-18 ENCOUNTER — Telehealth: Payer: Self-pay | Admitting: *Deleted

## 2019-12-18 NOTE — Telephone Encounter (Signed)
Called funeral home and spoke to answering service -Tyler Holloway and she states she will send the message . I had already faxed it to them at [phone] but they can come and pick up the original certification at anytime

## 2019-12-27 NOTE — Telephone Encounter (Signed)
Call from [place] with hospice that patient expired at 3:37 AM this morning

## 2019-12-27 DEATH — deceased

## 2020-04-22 ENCOUNTER — Ambulatory Visit: Payer: BC Managed Care – PPO | Admitting: Radiation Oncology

## 2021-04-16 IMAGING — CT CT NECK W/ CM
3 of 4 series · 12 of 33 positions shown, 14 images · IV contrast (omnipaque)
Comparison: None available.

CLINICAL DATA: Initial evaluation for acute sore throat for 1
month.

EXAM:
CT NECK WITH CONTRAST
TECHNIQUE: Multidetector CT imaging of the neck was performed using the
standard protocol following the bolus administration of intravenous
contrast.
CONTRAST:  75mL OMNIPAQUE IOHEXOL 300 MG/ML  SOLN

[Series 2: axial neck · axial · 0.57mm/px · z∈[-337,-161]mm · 4 of 133 slices shown, 5 images]
[im 23/133  soft-tissue]
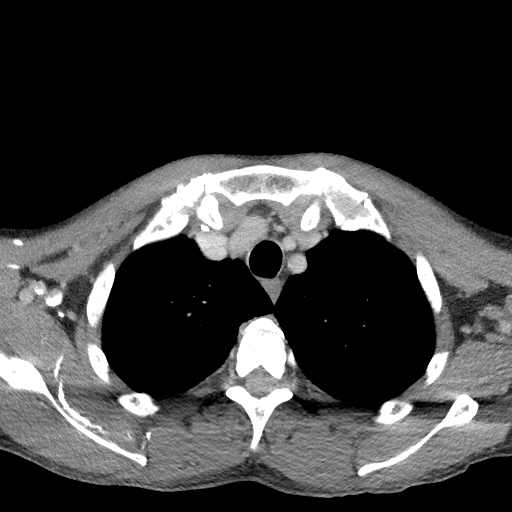
[im 23/133  bone]
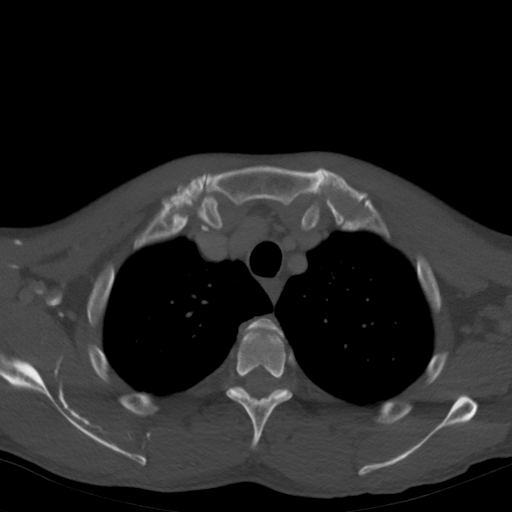
[im 45/133  bone]
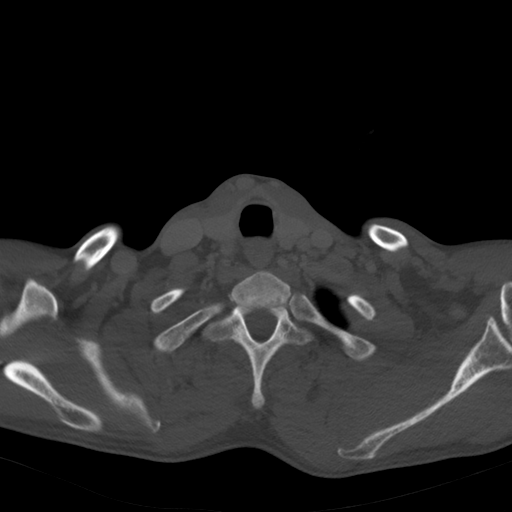
[im 89/133  bone]
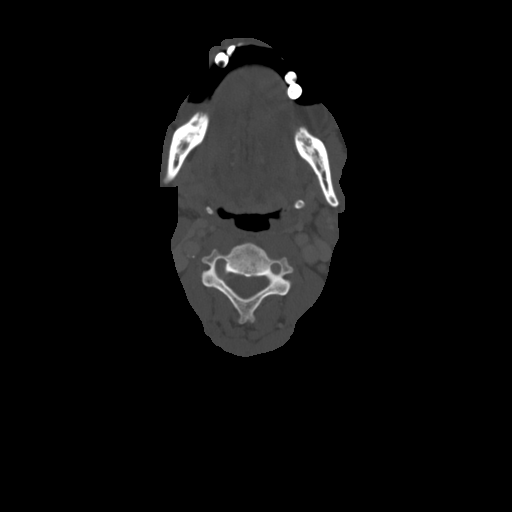
[im 111/133  bone]
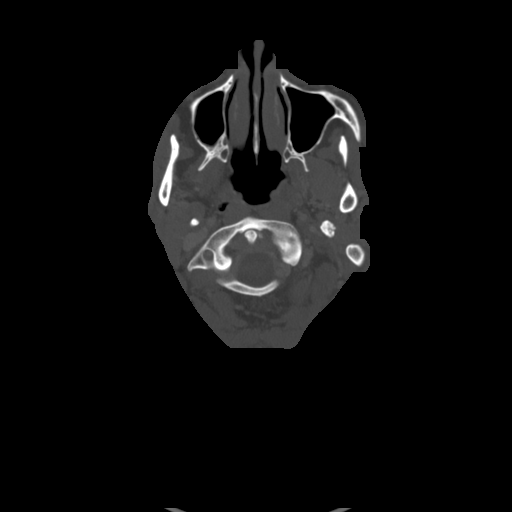

[Series 5: sag neck · sagittal · 0.46mm/px · 5 of 92 slices shown, 6 images]
[im 31/92  bone]
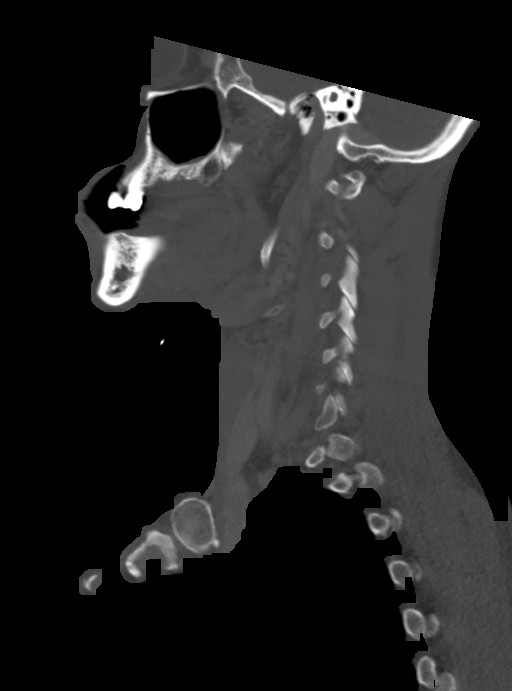
[im 38/92  bone]
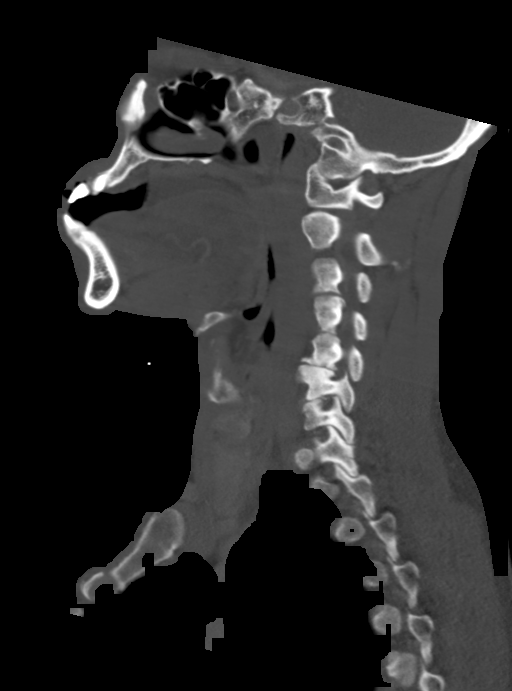
[im 46/92  soft-tissue]
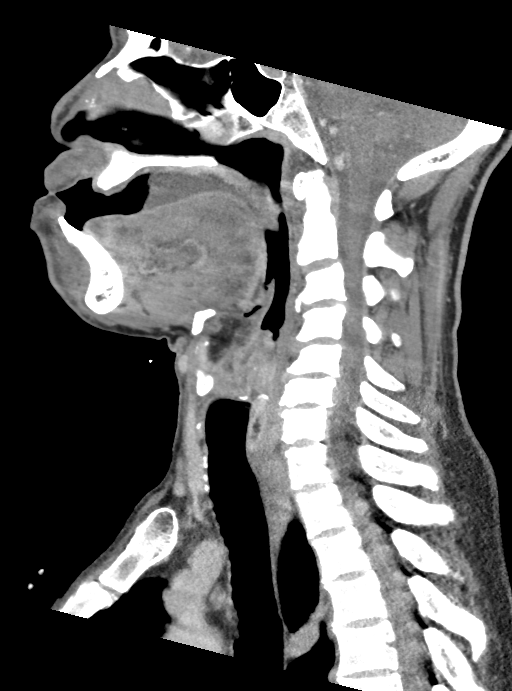
[im 46/92  bone]
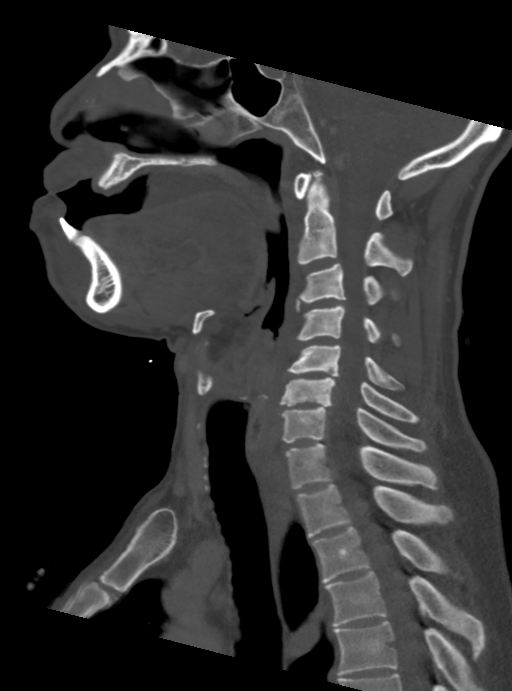
[im 54/92  bone]
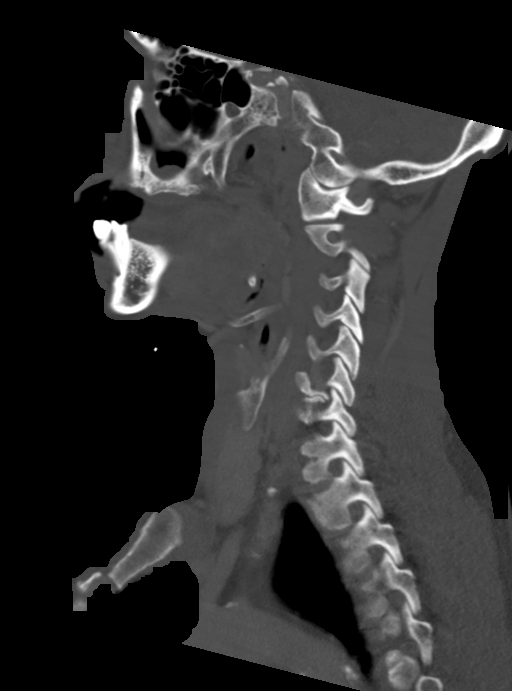
[im 61/92  bone]
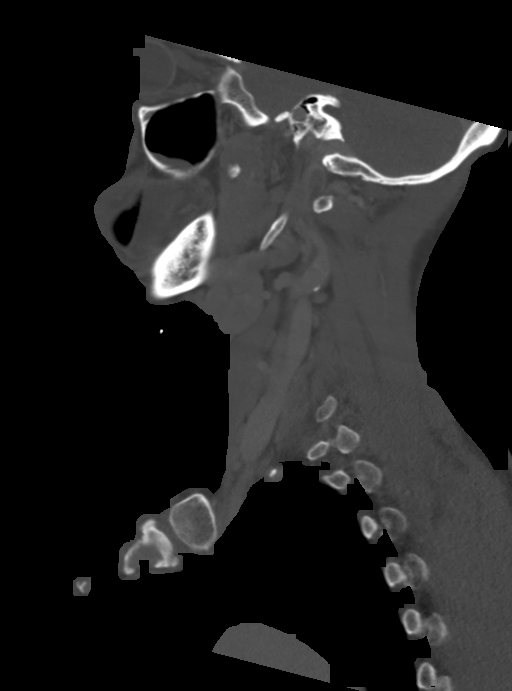

[Series 6: cor neck · coronal · 0.36mm/px · 3 of 95 slices shown]
[im 19/95  bone]
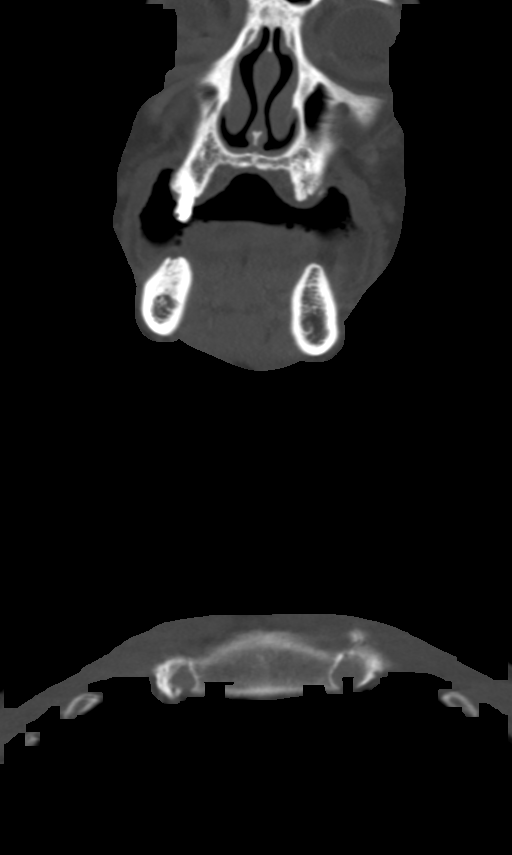
[im 38/95  bone]
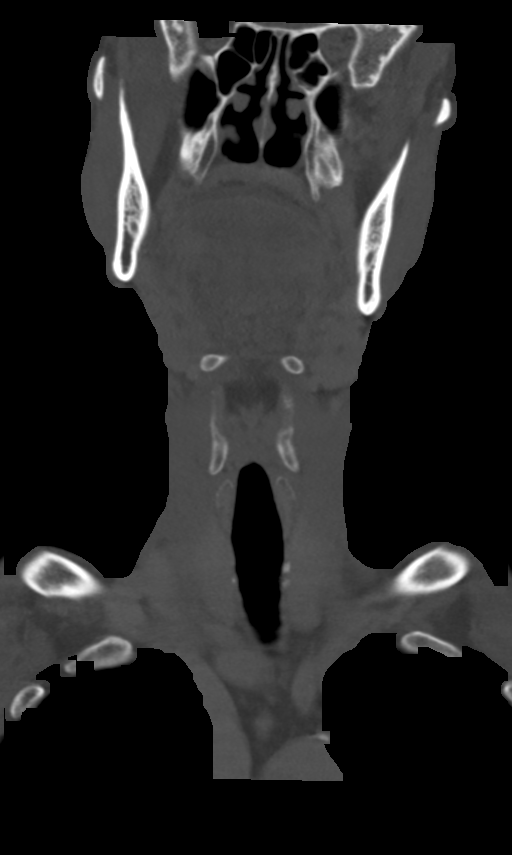
[im 57/95  bone]
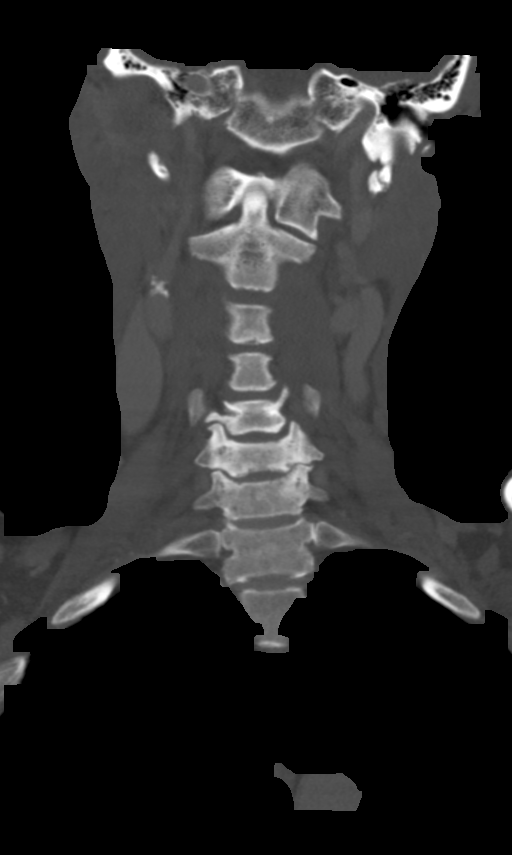

[12 of 33 positions shown; findings below may reference images not displayed]

FINDINGS: Pharynx and larynx: Small volume retained secretions seen within the
oral cavity. Oral cavity otherwise within normal limits without
abnormality. No visible tongue base mass. Palatine tonsils symmetric
and within normal limits. Parapharyngeal fat maintained. Nasopharynx
within normal limits. No retropharyngeal collection. Epiglottis
within normal limits. There is asymmetric polypoid soft tissue
positioned at the base of the left aryepiglottic fold measuring
approximately 2.9 x 1.3 cm (series 2, image 64). Partial involvement
of the adjacent left piriform sinus. Pre epiglottic fat is
preserved. Small volume layering secretions noted within the
adjacent hypopharynx. Glottis is opposed inferiorly and not well
assessed. Subglottic airway clear.

Salivary glands: Salivary glands including the parotid glands and
submandibular glands are within normal limits.

Thyroid: Thyroid within normal limits.

Lymph nodes: No pathologically enlarged lymph nodes seen within the
neck.

Vascular: Normal intravascular enhancement seen throughout the neck.
Moderate atherosclerotic change about the aortic arch, carotid
bifurcations, and carotid siphons.

Limited intracranial: Unremarkable.

Visualized orbits: Visualized globes and orbital soft tissues within
normal limits.

Mastoids and visualized paranasal sinuses: Mild mucosal thickening
noted within the maxillary sinuses. Visualized paranasal sinuses are
otherwise clear. Visualize mastoids and middle ear cavities are well
pneumatized and free of fluid.

Skeleton: No acute osseous abnormality. No discrete or worrisome
osseous lesions. Moderate cervical spondylosis, most notable at C5-6
and C6-7.

Upper chest: Visualized upper chest demonstrates no acute finding.
Centrilobular and paraseptal emphysematous changes noted within the
visualized lungs.

Other: None.
IMPRESSION: 1. Apparent 2.9 x 1.3 cm polypoid soft tissue mass positioned at the
base of the left aryepiglottic fold, indeterminate, but could
reflect a primary head and neck malignancy. ENT referral for further
evaluation and direct visualization recommended.
2. No other acute abnormality within the neck.  No adenopathy.
3. Aortic Atherosclerosis ([id number].L) and Emphysema ([id number]).
